# Patient Record
Sex: Female | Born: 2000 | Race: White | Hispanic: No | Marital: Single | Smoking: Current every day smoker
Health system: Southern US, Community
[De-identification: ages and names within clinical notes are randomized; demographics above are authoritative.]

## PROBLEM LIST (undated history)

## (undated) DIAGNOSIS — M51369 Other intervertebral disc degeneration, lumbar region without mention of lumbar back pain or lower extremity pain: Secondary | ICD-10-CM

## (undated) DIAGNOSIS — Q279 Congenital malformation of peripheral vascular system, unspecified: Secondary | ICD-10-CM

## (undated) DIAGNOSIS — M5136 Other intervertebral disc degeneration, lumbar region: Secondary | ICD-10-CM

## (undated) DIAGNOSIS — N926 Irregular menstruation, unspecified: Secondary | ICD-10-CM

## (undated) DIAGNOSIS — Z87442 Personal history of urinary calculi: Secondary | ICD-10-CM

## (undated) DIAGNOSIS — M419 Scoliosis, unspecified: Secondary | ICD-10-CM

## (undated) DIAGNOSIS — I82409 Acute embolism and thrombosis of unspecified deep veins of unspecified lower extremity: Secondary | ICD-10-CM

## (undated) DIAGNOSIS — M5126 Other intervertebral disc displacement, lumbar region: Secondary | ICD-10-CM

## (undated) DIAGNOSIS — D65 Disseminated intravascular coagulation [defibrination syndrome]: Secondary | ICD-10-CM

## (undated) DIAGNOSIS — N3289 Other specified disorders of bladder: Secondary | ICD-10-CM

## (undated) HISTORY — DX: Scoliosis, unspecified: M41.9

## (undated) HISTORY — PX: UPPER ENDOSCOPY W/ SCLEROTHERAPY: SHX2606

## (undated) HISTORY — DX: Personal history of urinary calculi: Z87.442

## (undated) HISTORY — DX: Irregular menstruation, unspecified: N92.6

## (undated) HISTORY — DX: Congenital malformation of peripheral vascular system, unspecified: Q27.9

## (undated) HISTORY — DX: Other specified disorders of bladder: N32.89

---
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
## 2018-02-07 ENCOUNTER — Encounter: Payer: Self-pay | Admitting: Family Medicine

## 2018-02-07 ENCOUNTER — Ambulatory Visit (INDEPENDENT_AMBULATORY_CARE_PROVIDER_SITE_OTHER): Payer: No Typology Code available for payment source | Admitting: Family Medicine

## 2018-02-07 VITALS — BP 100/80 | Temp 99.9°F | Ht 63.0 in | Wt 207.0 lb

## 2018-02-07 DIAGNOSIS — R300 Dysuria: Secondary | ICD-10-CM

## 2018-02-07 DIAGNOSIS — R509 Fever, unspecified: Secondary | ICD-10-CM | POA: Diagnosis not present

## 2018-02-07 DIAGNOSIS — J029 Acute pharyngitis, unspecified: Secondary | ICD-10-CM

## 2018-02-07 DIAGNOSIS — J020 Streptococcal pharyngitis: Secondary | ICD-10-CM

## 2018-02-07 LAB — POCT RAPID STREP A (OFFICE): Rapid Strep A Screen: POSITIVE — AB

## 2018-02-07 MED ORDER — AMOXICILLIN 500 MG PO CAPS: 500.0000 mg | ORAL_CAPSULE | Freq: Three times a day (TID) | ORAL | 0 refills | Status: DC

## 2018-02-07 NOTE — Progress Notes (Signed)
   Subjective:    Patient ID: Joanna Higgins, female    DOB: 2001/02/19, 17 y.o.   MRN: [id number]  HPI  Patient is here today with complaints of a fever,headache, States she is having body aches all over.She is states she is urinating blood for two days now.She is having some burning while urinating.She also states while in the shower yesterday and today she tastes blood in her mouth. She has been taking tylenol which has not helped. She has vomited once yesterday. She has not appetite.She states she can not urinate for us at this time. A cup and wipe were given to the pt.  Review of Systems  Constitutional: Positive for fatigue and fever. Negative for activity change.  HENT: Negative for congestion, ear pain and rhinorrhea.   Eyes: Negative for discharge.  Respiratory: Negative for cough, shortness of breath and wheezing.   Cardiovascular: Negative for chest pain.  Gastrointestinal: Positive for abdominal pain and nausea. Negative for diarrhea.  Genitourinary: Positive for dysuria and frequency.       Objective:   Physical Exam  Constitutional: She appears well-developed and well-nourished. No distress.  HENT:  Head: Normocephalic and atraumatic.  Eyes: Right eye exhibits no discharge. Left eye exhibits no discharge.  Neck: No tracheal deviation present.  Cardiovascular: Normal rate, regular rhythm and normal heart sounds.  No murmur heard. Pulmonary/Chest: Effort normal and breath sounds normal. No respiratory distress. She has no wheezes. She has no rales.  Musculoskeletal: She exhibits no edema.  Lymphadenopathy:    She has no cervical adenopathy.  Neurological: She is alert. She exhibits normal muscle tone.  Skin: Skin is warm and dry. No erythema.  Psychiatric: Her behavior is normal.  Vitals reviewed.         Assessment & Plan:  Strep throat Positive strep test Antibiotics prescribed Warning signs and what to watch for discussed  Send us a urine somewhere in  the next few days patient unable to give one today she complained of slight amount of blood in her urine this needs to be looked into further  The mother was also spoken with regarding her symptoms patient given work note for the next 3 days

## 2018-02-09 ENCOUNTER — Telehealth (INDEPENDENT_AMBULATORY_CARE_PROVIDER_SITE_OTHER): Payer: No Typology Code available for payment source | Admitting: *Deleted

## 2018-02-09 DIAGNOSIS — R319 Hematuria, unspecified: Secondary | ICD-10-CM | POA: Diagnosis not present

## 2018-02-09 LAB — POCT URINALYSIS DIPSTICK
Blood, UA: 10
PH UA: 6 (ref 5.0–8.0)
Spec Grav, UA: 1.02 (ref 1.010–1.025)
Urobilinogen, UA: 4 E.U./dL — AB

## 2018-02-09 NOTE — Telephone Encounter (Signed)
Urine sent for culture per dr Lorin Picketscott

## 2018-02-09 NOTE — Telephone Encounter (Signed)
Pt dropped off urine today. Seen 2 days ago and not able to give urine sample at that time.  Results for orders placed or performed in visit on 02/09/18  POCT urinalysis dipstick  Result Value Ref Range   Color, UA     Clarity, UA     Glucose, UA     Bilirubin, UA +    Ketones, UA     Spec Grav, UA 1.020 1.010 - 1.025   Blood, UA 10    pH, UA 6.0 5.0 - 8.0   Protein, UA     Urobilinogen, UA 4.0 (A) 0.2 or 1.0 E.U./dL   Nitrite, UA     Leukocytes, UA  Negative   Appearance     Odor

## 2018-02-13 ENCOUNTER — Other Ambulatory Visit: Payer: Self-pay | Admitting: *Deleted

## 2018-02-13 LAB — URINE CULTURE

## 2018-02-13 LAB — SPECIMEN STATUS REPORT

## 2018-02-13 MED ORDER — CEFDINIR 300 MG PO CAPS: 300.0000 mg | ORAL_CAPSULE | Freq: Two times a day (BID) | ORAL | 0 refills | Status: DC

## 2018-04-13 ENCOUNTER — Telehealth: Payer: Self-pay | Admitting: *Deleted

## 2018-04-13 NOTE — Telephone Encounter (Signed)
Spoke with pt. Pt states she is having stomach pain and has blood in urine. Pt wonders if IUD has moved. I advised she needs to be seen somewhere. I advised Urgent Care where they could check her urine and get med if needed. [facility] JSY

## 2018-04-19 ENCOUNTER — Ambulatory Visit: Payer: No Typology Code available for payment source | Admitting: Advanced Practice Midwife

## 2018-04-27 ENCOUNTER — Encounter: Payer: Self-pay | Admitting: Advanced Practice Midwife

## 2018-04-27 ENCOUNTER — Ambulatory Visit (INDEPENDENT_AMBULATORY_CARE_PROVIDER_SITE_OTHER): Payer: Medicaid Other | Admitting: Advanced Practice Midwife

## 2018-04-27 VITALS — BP 100/60 | HR 98 | Ht 63.0 in | Wt 199.0 lb

## 2018-04-27 DIAGNOSIS — Z309 Encounter for contraceptive management, unspecified: Secondary | ICD-10-CM

## 2018-04-27 DIAGNOSIS — Z01419 Encounter for gynecological examination (general) (routine) without abnormal findings: Secondary | ICD-10-CM

## 2018-04-27 MED ORDER — NORETHINDRONE 0.35 MG PO TABS: 1.0000 | ORAL_TABLET | Freq: Every day | ORAL | 11 refills | Status: DC

## 2018-04-27 NOTE — Progress Notes (Signed)
Joanna Higgins 17 y.o.  Vitals:   04/27/18 1523  BP: 100/60  Pulse: 98     Filed Weights   04/27/18 1523  Weight: 199 lb (90.3 kg)    Past Medical History: Past Medical History:  Diagnosis Date  . Bladder instability   . Irregular menstrual bleeding 02/10/2016  . Scoliosis   . Vascular malformation     Past Surgical History: Past Surgical History:  Procedure Laterality Date  . UPPER ENDOSCOPY W/ SCLEROTHERAPY      Family History: Family History  Problem Relation Age of Onset  . Arthritis Mother   . Arthritis Father   . Arthritis Maternal Grandmother   . Diabetes Maternal Grandfather   . Arthritis Maternal Grandfather   . Arthritis Paternal Grandfather     Social History: Social History   Tobacco Use  . Smoking status: Current Some Day Smoker    Types: E-cigarettes  . Smokeless tobacco: Never Used  Substance Use Topics  . Alcohol use: No    Alcohol/week: 0.0 oz  . Drug use: No    Allergies:  Allergies  Allergen Reactions  . 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 Hydrocodone Nausea And Vomiting      Current Outpatient Medications:  .  cefdinir (OMNICEF) 300 MG capsule, Take 1 capsule (300 mg total) by mouth 2 (two) times daily. (Patient not taking: Reported on 04/27/2018), Disp: 14 capsule, Rfl: 0  History of Present Illness: here for FP physical. Got Mirena 2/17. watns it out d/t cramps Can't take estrogen. Wants pills  May want to get pregnant later this year.  Says she can remember to take pill the same time every day   Review of Systems   Patient denies any headaches, blurred vision, shortness of breath, chest pain, problems with bowel movements, urination, or intercourse.   Physical Exam: General:  Well developed, well nourished, no acute distress Skin:  Warm and dry Neck:  Midline trachea, normal thyroid Lungs; Clear to auscultation bilaterally Breast:  No dominant palpable  mass, retraction, or nipple discharge Cardiovascular: Regular rate and rhythm Abdomen:  Soft, non tender, no hepatosplenomegaly Pelvic:  External genitalia is normal in appearance.  The vagina is normal in appearance.  The cervix is nulliparous.IUD strings visible Bedside US shows propter placement if IUD  Uterus is felt to be normal size, shape, stand contour.  No adnexal masses or tenderness noted. Exam limited by habitus Extremities:  No swelling or varicosities noted Psych:  No mood changes.     Impression: Normal GYN exam      Plan: start Micronor  Condom demonstration given. Remove IUD in 3 weeks

## 2018-05-18 ENCOUNTER — Encounter: Payer: Self-pay | Admitting: Advanced Practice Midwife

## 2018-05-18 ENCOUNTER — Ambulatory Visit (INDEPENDENT_AMBULATORY_CARE_PROVIDER_SITE_OTHER): Payer: Medicaid Other | Admitting: Advanced Practice Midwife

## 2018-05-18 VITALS — BP 110/80 | HR 94 | Ht 63.0 in | Wt 197.0 lb

## 2018-05-18 DIAGNOSIS — Z30432 Encounter for removal of intrauterine contraceptive device: Secondary | ICD-10-CM | POA: Diagnosis not present

## 2018-05-18 NOTE — Progress Notes (Signed)
  Joanna Higgins 17 y.o.  Vitals:   05/18/18 1050  BP: 110/80  Pulse: 94   Past Medical History:  Diagnosis Date  . Bladder instability   . Irregular menstrual bleeding 02/10/2016  . Scoliosis   . Vascular malformation    Past Surgical History:  Procedure Laterality Date  . UPPER ENDOSCOPY W/ SCLEROTHERAPY     family history includes Arthritis in her father, maternal grandfather, maternal grandmother, mother, and paternal grandfather; Diabetes in her maternal grandfather.  Current Outpatient Medications:  .  norethindrone (ORTHO MICRONOR) 0.35 MG tablet, Take 1 tablet (0.35 mg total) by mouth daily., Disp: 1 Package, Rfl: 11 .  cefdinir (OMNICEF) 300 MG capsule, Take 1 capsule (300 mg total) by mouth 2 (two) times daily. (Patient not taking: Reported on 04/27/2018), Disp: 14 capsule, Rfl: 0    Here for IUD removal.  She had the Mirena IUD placed in 2017 and would like it removed due to cramps. Started on POPs last month, remebers to take them.  A graves speculum was placed, and the strings were visible.  They were grasped with a curved Tresa Endo and the IUD easily removed.  Pt given IUD removal f/u instructions.

## 2018-06-07 ENCOUNTER — Ambulatory Visit: Payer: Medicaid Other | Admitting: Family Medicine

## 2018-06-22 ENCOUNTER — Encounter (HOSPITAL_COMMUNITY): Payer: Self-pay

## 2018-06-22 ENCOUNTER — Emergency Department (HOSPITAL_COMMUNITY): Payer: Self-pay

## 2018-06-22 ENCOUNTER — Ambulatory Visit (HOSPITAL_COMMUNITY)
Admission: RE | Admit: 2018-06-22 | Discharge: 2018-06-22 | Disposition: A | Discharge status: Home / Self Care | Payer: Self-pay | Source: Ambulatory Visit | Attending: Emergency Medicine | Admitting: Emergency Medicine

## 2018-06-22 ENCOUNTER — Emergency Department (HOSPITAL_COMMUNITY)
Admission: EM | Admit: 2018-06-22 | Discharge: 2018-06-22 | Disposition: A | Discharge status: Home / Self Care | Payer: Self-pay | Attending: Emergency Medicine | Admitting: Emergency Medicine

## 2018-06-22 DIAGNOSIS — Z86718 Personal history of other venous thrombosis and embolism: Secondary | ICD-10-CM | POA: Insufficient documentation

## 2018-06-22 DIAGNOSIS — R000 Tachycardia, unspecified: Secondary | ICD-10-CM | POA: Insufficient documentation

## 2018-06-22 DIAGNOSIS — M79604 Pain in right leg: Secondary | ICD-10-CM

## 2018-06-22 DIAGNOSIS — Z79899 Other long term (current) drug therapy: Secondary | ICD-10-CM | POA: Insufficient documentation

## 2018-06-22 DIAGNOSIS — F17290 Nicotine dependence, other tobacco product, uncomplicated: Secondary | ICD-10-CM | POA: Insufficient documentation

## 2018-06-22 LAB — CBC WITH DIFFERENTIAL/PLATELET
Basophils Absolute: 0 10*3/uL (ref 0.0–0.1)
Basophils Relative: 0 %
Eosinophils Absolute: 0.5 10*3/uL (ref 0.0–0.7)
Eosinophils Relative: 4 %
HEMATOCRIT: 42.7 % (ref 36.0–46.0)
Hemoglobin: 13.7 g/dL (ref 12.0–15.0)
LYMPHS PCT: 31 %
Lymphs Abs: 4 10*3/uL (ref 0.7–4.0)
MCH: 27.3 pg (ref 26.0–34.0)
MCHC: 32.1 g/dL (ref 30.0–36.0)
MCV: 85.1 fL (ref 78.0–100.0)
MONOS PCT: 9 %
Monocytes Absolute: 1.1 10*3/uL — ABNORMAL HIGH (ref 0.1–1.0)
NEUTROS ABS: 7.4 10*3/uL (ref 1.7–7.7)
NEUTROS PCT: 56 %
PLATELETS: 282 10*3/uL (ref 150–400)
RBC: 5.02 MIL/uL (ref 3.87–5.11)
RDW: 14.1 % (ref 11.5–15.5)
WBC: 13 10*3/uL — ABNORMAL HIGH (ref 4.0–10.5)

## 2018-06-22 LAB — BASIC METABOLIC PANEL
Anion gap: 9 (ref 5–15)
BUN: 16 mg/dL (ref 6–20)
CO2: 25 mmol/L (ref 22–32)
Calcium: 9.3 mg/dL (ref 8.9–10.3)
Chloride: 107 mmol/L (ref 98–111)
Creatinine, Ser: 0.73 mg/dL (ref 0.44–1.00)
GFR calc Af Amer: >60 mL/min (ref >60)
GLUCOSE: 129 mg/dL — AB (ref 70–99)
POTASSIUM: 3.8 mmol/L (ref 3.5–5.1)
Sodium: 141 mmol/L (ref 135–145)

## 2018-06-22 LAB — I-STAT BETA HCG BLOOD, ED (MC, WL, AP ONLY)

## 2018-06-22 MED ORDER — METHOCARBAMOL 500 MG PO TABS: 500.0000 mg | ORAL_TABLET | Freq: Three times a day (TID) | ORAL | 0 refills | Status: DC | PRN

## 2018-06-22 MED ORDER — ENOXAPARIN SODIUM 100 MG/ML ~~LOC~~ SOLN
90.0000 mg | Freq: Once | SUBCUTANEOUS | Status: AC
  Administered 2018-06-22: 90 mg via SUBCUTANEOUS
  Filled 2018-06-22: qty 1

## 2018-06-22 MED ORDER — SODIUM CHLORIDE 0.9 % IV BOLUS
1000.0000 mL | Freq: Once | INTRAVENOUS | Status: AC
Start: 2018-06-22 — End: 2018-06-22
  Administered 2018-06-22: 1000 mL via INTRAVENOUS

## 2018-06-22 MED ORDER — IOPAMIDOL (ISOVUE-370) INJECTION 76%
100.0000 mL | Freq: Once | INTRAVENOUS | Status: AC | PRN
  Administered 2018-06-22: 100 mL via INTRAVENOUS

## 2018-06-22 MED ORDER — METHYLPREDNISOLONE 4 MG PO TBPK: ORAL_TABLET | ORAL | 0 refills | Status: DC

## 2018-06-22 MED ORDER — KETOROLAC TROMETHAMINE 30 MG/ML IJ SOLN
30.0000 mg | Freq: Once | INTRAMUSCULAR | Status: AC
  Administered 2018-06-22: 30 mg via INTRAVENOUS
  Filled 2018-06-22: qty 1

## 2018-06-22 NOTE — ED Notes (Signed)
Patient transported to CT 

## 2018-06-22 NOTE — ED Triage Notes (Signed)
Pt reports pain in her right leg from the ankle to the hip x 4 days, states she was diagnosed with blood clot in upper leg when seen at [facility] last month, states has been on lovenox but has run out a week ago.

## 2018-06-22 NOTE — ED Provider Notes (Signed)
[facility] EMERGENCY DEPARTMENT Provider Note   CSN: [id number] Arrival date & time: 06/22/18  0219     History   Chief Complaint Chief Complaint  Patient presents with  . Leg Pain    hx of dvt    HPI Joanna Higgins is a 17 y.o. female.  Patient is a very difficult historian.  She is here with right leg pain from her hip all the way down to her toes which is been ongoing for the past 1 month.  She denies any falls or trauma.  States she was seen at [facility] about 2 weeks ago and told she had a DVT in her right leg and was given 12 days worth of Lovenox which she finished 2 days ago.  She is unclear what the clot extent was and she is unclear what the plan was after these 12 days of Lovenox.  She is never had a blood clot before.  She states that she came in tonight because the pain is worsening and radiates from her buttock all the way down her leg.  There is no numbness or tingling.  There is no bowel or bladder incontinence.  There is no fever or vomiting.  There is no history of cancer or IV drug abuse.  Patient did have a MRI of her lumbar spine in October that showed multilevel disc bulging.  No previous back surgery.  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  She was told to stop her birth control because of the blood clot.  The history is provided by the patient.  Leg Pain      Past Medical History:  Diagnosis Date  . Bladder instability   . Irregular menstrual bleeding 02/10/2016  . Scoliosis   . Vascular malformation     Patient Active Problem List   Diagnosis Date Noted  . Encounter for IUD insertion 02/18/2016  . Irregular menstrual bleeding 02/10/2016  . Learning disability 08/22/2015  . ADD (attention deficit disorder) 08/22/2015  . 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 DUB (dysfunctional  uterine bleeding) 12/03/2013  . Anemia 12/03/2013  . Scoliosis 04/28/2013  . Adiposity 10/19/2012  . 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 UPPER ENDOSCOPY W/ SCLEROTHERAPY       OB History    Gravida  0   Para  0   Term  0   Preterm  0   AB  0   Living  0     SAB  0   TAB  0   Ectopic  0   Multiple  0   Live Births               Home Medications    Prior to Admission medications   Medication Sig Start Date End Date Taking? Authorizing Provider  cefdinir (OMNICEF) 300 MG capsule Take 1 capsule (300 mg total) by mouth 2 (two) times daily. Patient not taking: Reported on 04/27/2018 02/13/18   Babs Sciara, MD  norethindrone (ORTHO MICRONOR) 0.35 MG tablet Take 1 tablet (0.35 mg total) by mouth daily. 04/27/18   Jacklyn Shell, CNM    Family History Family History  Problem Relation Age of Onset  . Arthritis Mother   . Arthritis Father   . Arthritis Maternal Grandmother   . Diabetes Maternal  Grandfather   . Arthritis Maternal Grandfather   . Arthritis Paternal Grandfather     Social History Social History   Tobacco Use  . Smoking status: Current Some Day Smoker    Types: E-cigarettes  . Smokeless tobacco: Never Used  Substance Use Topics  . Alcohol use: No    Alcohol/week: 0.0 oz  . Drug use: No     Allergies   Estrogens and Hydrocodone   Review of Systems Review of Systems  Constitutional: Negative for activity change, appetite change and fever.  HENT: Negative for congestion and sinus pain.   Eyes: Negative for visual disturbance.  Respiratory: Negative for cough, chest tightness and shortness of breath.   Cardiovascular: Positive for leg swelling. Negative for chest pain.  Gastrointestinal: Negative for abdominal pain, nausea and vomiting.  Genitourinary: Negative for dysuria, hematuria, vaginal bleeding and vaginal discharge.  Musculoskeletal: Positive for  arthralgias and myalgias. Negative for back pain.  Neurological: Negative for dizziness, weakness and headaches.   all other systems are negative except as noted in the HPI and PMH.     Physical Exam Updated Vital Signs BP (!) 128/99 (BP Location: Right Arm)   Pulse (!) 124   Temp 98.4 F (36.9 C) (Oral)   Resp 18   Ht 5\' 3"  (1.6 m)   Wt 89.4 kg (197 lb)   LMP 06/14/2018   SpO2 99%   BMI 34.90 kg/m   Physical Exam  Constitutional: She is oriented to person, place, and time. She appears well-developed and well-nourished. No distress.  HENT:  Head: Normocephalic and atraumatic.  Mouth/Throat: Oropharynx is clear and moist. No oropharyngeal exudate.  Eyes: Pupils are equal, round, and reactive to light. Conjunctivae and EOM are normal.  Neck: Normal range of motion. Neck supple.  No meningismus.  Cardiovascular: Normal rate, normal heart sounds and intact distal pulses.  No murmur heard. Tachycardic 120s  Pulmonary/Chest: Effort normal and breath sounds normal. No respiratory distress.  Abdominal: Soft. There is no tenderness. There is no rebound and no guarding.  Musculoskeletal: Normal range of motion. She exhibits tenderness. She exhibits no edema.  Multiple vascular malformations her right flank and abdomen.  There is no midline tenderness  5/5 strength in bilateral lower extremities. Ankle plantar and dorsiflexion intact. Great toe extension intact bilaterally. +2 DP and PT pulses. +2 patellar reflexes bilaterally. Normal gait.  There is no calf asymmetry or palpable cords.  There is no asymmetric swelling.  Distal pulses are intact  Neurological: She is alert and oriented to person, place, and time. No cranial nerve deficit. She exhibits normal muscle tone. Coordination normal.   5/5 strength throughout. CN 2-12 intact.Equal grip strength.   Skin: Skin is warm.  Psychiatric: She has a normal mood and affect. Her behavior is normal.  Nursing note and vitals  reviewed.    ED Treatments / Results  Labs (all labs ordered are listed, but only abnormal results are displayed) Labs Reviewed  CBC WITH DIFFERENTIAL/PLATELET - Abnormal; Notable for the following components:      Result Value   WBC 13.0 (*)    Monocytes Absolute 1.1 (*)    All other components within normal limits  BASIC METABOLIC PANEL - Abnormal; Notable for the following components:   Glucose, Bld 129 (*)    All other components within normal limits  URINALYSIS, ROUTINE W REFLEX MICROSCOPIC  RAPID URINE DRUG SCREEN, HOSP PERFORMED  I-STAT BETA HCG BLOOD, ED ([facility], [facility], [facility] ONLY)    EKG None  Radiology Ct Angio Chest Pe W And/or Wo Contrast  Result Date: 06/22/2018 CLINICAL DATA:  17 year old female with right leg pain and recent history of DVT. Concern for pulmonary embolism. EXAM: CT ANGIOGRAPHY CHEST WITH CONTRAST TECHNIQUE: Multidetector CT imaging of the chest was performed using the standard protocol during bolus administration of intravenous contrast. Multiplanar CT image reconstructions and MIPs were obtained to evaluate the vascular anatomy. CONTRAST:  ISOVUE-370 IOPAMIDOL (ISOVUE-370) INJECTION 76% COMPARISON:  Chest radiograph dated 11/23/2017 FINDINGS: Cardiovascular: There is no cardiomegaly or pericardial effusion. The thoracic aorta is unremarkable. The origins of the great vessels of the aortic arch are patent. There is no CT evidence of pulmonary embolism. Mediastinum/Nodes: No hilar or mediastinal adenopathy. Esophagus and the thyroid gland are grossly unremarkable. Residual thymic tissue noted in the anterior mediastinum. Lungs/Pleura: The lungs are clear. There is no pleural effusion or pneumothorax. The central airways are patent. Upper Abdomen: No acute abnormality. Musculoskeletal: Thoracic dextroscoliosis. No acute osseous pathology. Review of the MIP images confirms the above findings. IMPRESSION: No acute intrathoracic pathology. No CT evidence of pulmonary  embolism. Electronically Signed   By: Elgie Collard M.D.   On: 06/22/2018 04:25    Procedures Procedures (including critical care time)  Medications Ordered in ED Medications  iopamidol (ISOVUE-370) 76 % injection 100 mL (100 mLs Intravenous Contrast Given 06/22/18 0407)     Initial Impression / Assessment and Plan / ED Course  I have reviewed the triage vital signs and the nursing notes.  Pertinent labs & imaging results that were available during my care of the patient were reviewed by me and considered in my medical decision making (see chart for details).    Patient with 1 month of right leg pain, recently diagnosed with DVT at outside hospital and completed 12 days of Lovenox.  No documentation available in care everywhere.  Patient did have Doppler study in 2017 which is negative. we will attempt to obtain records.  Her presentation today seems more consistent with sciatica.  There is no evidence of cauda equina or cord compression.  Patient did have MRI in 2018 that showed disc bulging at L5-S1.  Given patient's tachycardia and complaint of shortness of breath and history of DVT, CT scan obtained to evaluate for pulmonary embolism.  This is negative.  Record for DVT study was requested from [facility] but they do not have any records available.  Patient's mother has arrived.  She states that patient is confused over her history.  Patient is prescribed Lovenox for her vascular malformation of her flank which she uses from time to time.  She is never been diagnosed with a DVT in her leg.  Mother states she has not been to [facility] since 2016.  Patient's presentation today seems consistent with sciatica.  There are no neurological red flags.  Will give dose of Lovenox and obtain DVT study in the morning.  Treat for sciatica.  No evidence of cord compression or cauda equina.  Patient to return later today for DVT study of right leg.  Return precautions discussed.  Heart rate has improved to  the 90s.  Final Clinical Impressions(s) / ED Diagnoses   Final diagnoses:  Right leg pain    ED Discharge Orders    None       Zohar Maroney, Jeannett Senior, MD 06/22/18 [phone]

## 2018-06-22 NOTE — Discharge Instructions (Addendum)
It appears your pain is likely due to sciatica or pinched nerve in your back.  Take the steroids and anti-inflammatories as prescribed.  You should follow-up later today to have an ultrasound of your leg to rule out blood clot.  Though it does not seem that she had a blood clot in this leg in the past.  Follow-up with your doctor.  Return to the ED if you develop new or worsening symptoms.

## 2018-06-22 NOTE — ED Provider Notes (Signed)
Blood pressure 103/60, pulse 97, temperature 98.4 F (36.9 C), temperature source Oral, resp. rate 18, height 5\' 3"  (1.6 m), weight 89.4 kg (197 lb), last menstrual period 06/14/2018, SpO2 100 %.   In short, Joanna Higgins is a 17 y.o. female with a chief complaint of Leg Pain (hx of dvt) .  Refer to the original H&P for additional details.  09:31 AM Patient returns to the emergency department this morning for right lower extremity ultrasound to rule out DVT.  Clinical suspicion upon her initial exam was elevated for sciatica but patient had initially reported recent history of DVT.  This was contradicted by her mother who arrived later.  Ultrasound shows no evidence of acute right lower extremity DVT.   Alona BeneJoshua Estefana Taylor, MD     Maia PlanLong, Eurika Sandy G, MD 06/22/18 [phone]

## 2018-06-25 ENCOUNTER — Encounter (HOSPITAL_COMMUNITY): Payer: Self-pay | Admitting: Emergency Medicine

## 2018-06-25 ENCOUNTER — Emergency Department (HOSPITAL_COMMUNITY)
Admission: EM | Admit: 2018-06-25 | Discharge: 2018-06-26 | Disposition: A | Discharge status: Home / Self Care | Payer: Medicaid Other | Attending: Emergency Medicine | Admitting: Emergency Medicine

## 2018-06-25 DIAGNOSIS — Z79899 Other long term (current) drug therapy: Secondary | ICD-10-CM | POA: Insufficient documentation

## 2018-06-25 DIAGNOSIS — F17210 Nicotine dependence, cigarettes, uncomplicated: Secondary | ICD-10-CM | POA: Insufficient documentation

## 2018-06-25 DIAGNOSIS — M5416 Radiculopathy, lumbar region: Secondary | ICD-10-CM | POA: Insufficient documentation

## 2018-06-25 DIAGNOSIS — M5410 Radiculopathy, site unspecified: Secondary | ICD-10-CM

## 2018-06-25 MED ORDER — KETOROLAC TROMETHAMINE 60 MG/2ML IM SOLN
30.0000 mg | Freq: Once | INTRAMUSCULAR | Status: AC
  Administered 2018-06-26: 30 mg via INTRAMUSCULAR
  Filled 2018-06-25: qty 2

## 2018-06-25 MED ORDER — CYCLOBENZAPRINE HCL 10 MG PO TABS: 10.0000 mg | ORAL_TABLET | Freq: Every day | ORAL | 0 refills | Status: AC

## 2018-06-25 MED ORDER — CYCLOBENZAPRINE HCL 10 MG PO TABS
5.0000 mg | ORAL_TABLET | Freq: Once | ORAL | Status: AC
  Administered 2018-06-26: 5 mg via ORAL
  Filled 2018-06-25: qty 1

## 2018-06-25 NOTE — Discharge Instructions (Addendum)
You may use over-the-counter Motrin (Ibuprofen), Acetaminophen (Tylenol), topical muscle creams such as SalonPas, Federal-Mogulcy Hot, Bengay, etc. Please stretch, apply heat or cold, and have massage therapy for additional assistance.

## 2018-06-25 NOTE — ED Provider Notes (Addendum)
[facility] EMERGENCY DEPARTMENT Provider Note  CSN: [id number] Arrival date & time: 06/25/18 2007  Chief Complaint(s) Leg Pain  HPI Joanna Higgins is a 17 y.o. female with a history of scoliosis and AVMs with flank varicosities requiring intermittent Lovenox treatments, noted to have bulging disc in L5/S1 noted on MRI obtain October 2018,  who presents to the emergency department with right lower back pain radiating to the right lower extremity.  There is been ongoing for several weeks.  Pain is shooting and throbbing in nature.  Exacerbated with movement and palpation of the lower back and right lower extremity.  No associated swelling.  Alleviated only with immobility.  Patient was seen 3 days ago at any pain and ruled out for DVT.  She was prescribed muscle relaxers but has not noted any improvement.  She denies any recent trauma or injuries.  No bladder/bowel incontinence.  No lower extremity weakness or loss of sensation.  HPI  Past Medical History Past Medical History:  Diagnosis Date  . Bladder instability   . Irregular menstrual bleeding 02/10/2016  . Scoliosis   . Vascular malformation    Patient Active Problem List   Diagnosis Date Noted  . Encounter for IUD insertion 02/18/2016  . Irregular menstrual bleeding 02/10/2016  . Learning disability 08/22/2015  . ADD (attention deficit disorder) 08/22/2015  . 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 DUB (dysfunctional uterine bleeding) 12/03/2013  . Anemia 12/03/2013  . Scoliosis 04/28/2013  . Adiposity 10/19/2012  . 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 Authorizing Provider  cefdinir (OMNICEF) 300 MG capsule Take 1 capsule (300 mg total) by mouth 2 (two) times daily. Patient not taking: Reported on 04/27/2018 02/13/18   Babs Sciara, MD   cyclobenzaprine (FLEXERIL) 10 MG tablet Take 1 tablet (10 mg total) by mouth at bedtime for 10 days. 06/25/18 07/05/18  Nira Conn, MD  methocarbamol (ROBAXIN) 500 MG tablet Take 1 tablet (500 mg total) by mouth every 8 (eight) hours as needed for muscle spasms. 06/22/18   Rancour, Jeannett Senior, MD  methylPREDNISolone (MEDROL DOSEPAK) 4 MG TBPK tablet As directed 06/22/18   Rancour, Jeannett Senior, MD  norethindrone (ORTHO MICRONOR) 0.35 MG tablet Take 1 tablet (0.35 mg total) by mouth daily. 04/27/18   Jacklyn Shell, CNM                                                                                                                                    Past Surgical History Past Surgical History:  Procedure Laterality Date  . UPPER ENDOSCOPY W/ SCLEROTHERAPY     Family History Family History  Problem Relation Age of Onset  . Arthritis Mother   . Arthritis Father   . Arthritis Maternal Grandmother   . Diabetes Maternal Grandfather   .  Arthritis Maternal Grandfather   . Arthritis Paternal Grandfather     Social History Social History   Tobacco Use  . Smoking status: Current Some Day Smoker    Types: E-cigarettes  . Smokeless tobacco: Never Used  Substance Use Topics  . Alcohol use: No    Alcohol/week: 0.0 oz  . Drug use: No   Allergies Estrogens and Hydrocodone  Review of Systems Review of Systems All other systems are reviewed and are negative for acute change except as noted in the HPI  Physical Exam Vital Signs  I have reviewed the triage vital signs BP 106/72 (BP Location: Right Arm)   Pulse (!) 120   Temp 98.4 F (36.9 C) (Oral)   Resp 16   Ht 5\' 3"  (1.6 m)   Wt 89.4 kg (197 lb)   LMP 06/14/2018   SpO2 99%   BMI 34.90 kg/m   Physical Exam  Constitutional: She is oriented to person, place, and time. She appears well-developed and well-nourished. No distress.  HENT:  Head: Normocephalic and atraumatic.  Right Ear: External ear normal.  Left Ear:  External ear normal.  Nose: Nose normal.  Eyes: Conjunctivae and EOM are normal. No scleral icterus.  Neck: Normal range of motion and phonation normal.  Cardiovascular: Normal rate and regular rhythm.  Pulmonary/Chest: Effort normal. No stridor. No respiratory distress.  Abdominal: She exhibits no distension.  Musculoskeletal: Normal range of motion. She exhibits no edema.       Lumbar back: She exhibits tenderness and pain.       Back:       Right upper leg: She exhibits tenderness. She exhibits no edema.  Neurological: She is alert and oriented to person, place, and time. She has normal strength. No sensory deficit.  Skin: She is not diaphoretic.  Psychiatric: She has a normal mood and affect. Her behavior is normal.  Vitals reviewed.   ED Results and Treatments Labs (all labs ordered are listed, but only abnormal results are displayed) Labs Reviewed - No data to display                                                                                                                       EKG  EKG Interpretation  Date/Time:    Ventricular Rate:    PR Interval:    QRS Duration:   QT Interval:    QTC Calculation:   R Axis:     Text Interpretation:        Radiology No results found. Pertinent labs & imaging results that were available during my care of the patient were reviewed by me and considered in my medical decision making (see chart for details).  Medications Ordered in ED Medications  ketorolac (TORADOL) injection 30 mg (has no administration in time range)  cyclobenzaprine (FLEXERIL) tablet 5 mg (has no administration in time range)  Procedures Procedures  (including critical care time)  Medical Decision Making / ED Course I have reviewed the nursing notes for this encounter and the patient's prior records (if available in EHR or on  provided paperwork).    Patient known to have herniated disks having radicular pain on the right side.  No evidence of cauda equina.  No need for repeat imaging.  Mother reports that patient already has a prescription for steroids but she has not filled it yet.   Treated with IM Toradol and oral Flexeril.  Will change prescription from Robaxin to Flexeril.  Patient already has an orthopedist and has a follow-up appointment within 2 weeks.  The patient appears reasonably screened and/or stabilized for discharge and I doubt any other medical condition or other [facility] requiring further screening, evaluation, or treatment in the ED at this time prior to discharge.  The patient is safe for discharge with strict return precautions.   Final Clinical Impression(s) / ED Diagnoses Final diagnoses:  Radicular pain    Disposition: Discharge  Condition: Good  I have discussed the results, Dx and Tx plan with the patient and motehrwho expressed understanding and agree(s) with the plan. Discharge instructions discussed at great length. The patient and mother were given strict return precautions who verbalized understanding of the instructions. No further questions at time of discharge.    ED Discharge Orders        Ordered    cyclobenzaprine (FLEXERIL) 10 MG tablet  Daily at bedtime     06/25/18 2337       Follow Up: Babs Sciara, MD [address] [place] [address] [phone]     Orthopedist   As scheduled     This chart was dictated using voice recognition software.  Despite best efforts to proofread,  errors can occur which can change the documentation meaning.     Nira Conn, MD 06/25/18 2337

## 2018-06-25 NOTE — ED Triage Notes (Addendum)
Pt c/o pain from R hip down R leg with numbness to R foot.  Pt is a [facility] patient for birth defect, vascular malformation in right lower back, pt has had several blood clots, no longer on Lovenox.  Pt was seen at [facility], had CT, dx with bulging disk. Per mother pt's pain is not controlled with medication .  Neg DVT study.  Per mother has not started prednisone and does have Ortho appointment on July 8th if insurance can be put back in place.

## 2018-06-27 ENCOUNTER — Ambulatory Visit (INDEPENDENT_AMBULATORY_CARE_PROVIDER_SITE_OTHER): Payer: Medicaid Other | Admitting: Advanced Practice Midwife

## 2018-06-27 ENCOUNTER — Other Ambulatory Visit: Payer: Self-pay

## 2018-06-27 ENCOUNTER — Encounter: Payer: Self-pay | Admitting: Advanced Practice Midwife

## 2018-06-27 VITALS — BP 109/73 | HR 98 | Ht 63.0 in | Wt 196.0 lb

## 2018-06-27 DIAGNOSIS — Z30017 Encounter for initial prescription of implantable subdermal contraceptive: Secondary | ICD-10-CM | POA: Diagnosis not present

## 2018-06-27 NOTE — Progress Notes (Signed)
[facility] Visit  Patient name: Joanna Higgins MRN [id number]  Date of birth: 2001/12/19  CC & HPI:  Joanna Higgins is a 17 y.o. Caucasian female presenting today for birth control options. Has been on micronoir for a few months, can't remember to take therm. Thought that she had a blood clot, but [place] neg.  Birth control options discussed: depo, Nexplanon, IUD (hormonal and nonhormonal).  Had IUD removed d/t cramping, so advised Nexplanon. Risks/benefits/side effects of each discussed.  Pt chooses Nexplanon.  Not having sex w/anyone now. LMP 6/19, not taking BC at all now. .    Pertinent History Reviewed:  Medical & Surgical Hx:   Past Medical History:  Diagnosis Date  . Bladder instability   . Irregular menstrual bleeding 02/10/2016  . Scoliosis   . Vascular malformation    Past Surgical History:  Procedure Laterality Date  . UPPER ENDOSCOPY W/ SCLEROTHERAPY     Family History  Problem Relation Age of Onset  . Arthritis Mother   . Arthritis Father   . Arthritis Maternal Grandmother   . Diabetes Maternal Grandfather   . Arthritis Maternal Grandfather   . Arthritis Paternal Grandfather     Current Outpatient Medications:  .  cyclobenzaprine (FLEXERIL) 10 MG tablet, Take 1 tablet (10 mg total) by mouth at bedtime for 10 days., Disp: 10 tablet, Rfl: 0 .  methocarbamol (ROBAXIN) 500 MG tablet, Take 1 tablet (500 mg total) by mouth every 8 (eight) hours as needed for muscle spasms., Disp: 20 tablet, Rfl: 0 .  methylPREDNISolone (MEDROL DOSEPAK) 4 MG TBPK tablet, As directed, Disp: 21 tablet, Rfl: 0 .  norethindrone (ORTHO MICRONOR) 0.35 MG tablet, Take 1 tablet (0.35 mg total) by mouth daily. (Patient not taking: Reported on 06/27/2018), Disp: 1 Package, Rfl: 11 Social History: Reviewed -  reports that she has been smoking e-cigarettes.  She has never used smokeless tobacco.  Review of Systems:   Constitutional: Negative for fever and chills Eyes: Negative for visual  disturbances Respiratory: Negative for shortness of breath, dyspnea Cardiovascular: Negative for chest pain or palpitations  Gastrointestinal: Negative for vomiting, diarrhea and constipation; no abdominal pain Genitourinary: Negative for dysuria and urgency, vaginal irritation or itching Musculoskeletal: Negative for back pain, joint pain, myalgias  Neurological: Negative for dizziness and headaches    Objective Findings:    Physical Examination: Vitals:   06/27/18 1008  BP: 109/73  Pulse: 98   General appearance - well appearing, and in no distress Mental status - alert, oriented to person, place, and time Chest:  Normal respiratory effort Heart - normal rate and regular rhythm Abdomen:  Soft, nontender Pelvic: deferred Musculoskeletal:  Normal range of motion without pain Extremities:  No edema    No results found for this or any previous visit (from the past 24 hour(s)).    Assessment & Plan:  A:   Contraception mgt P:     Return in about 3 weeks (around 07/18/2018) for order and schedule nexplanon.   [facility]  Yassine Brunsman Cresenzo-Dishmon CNM 06/27/2018 10:30 AM

## 2018-07-19 ENCOUNTER — Encounter: Payer: Medicaid Other | Admitting: Advanced Practice Midwife

## 2018-07-25 ENCOUNTER — Ambulatory Visit (INDEPENDENT_AMBULATORY_CARE_PROVIDER_SITE_OTHER): Payer: Medicaid Other | Admitting: Advanced Practice Midwife

## 2018-07-25 ENCOUNTER — Encounter: Payer: Self-pay | Admitting: Advanced Practice Midwife

## 2018-07-25 VITALS — BP 126/75 | HR 97 | Ht 63.0 in | Wt 198.0 lb

## 2018-07-25 DIAGNOSIS — Z3049 Encounter for surveillance of other contraceptives: Secondary | ICD-10-CM

## 2018-07-25 DIAGNOSIS — Z30017 Encounter for initial prescription of implantable subdermal contraceptive: Secondary | ICD-10-CM | POA: Insufficient documentation

## 2018-07-25 DIAGNOSIS — Z3202 Encounter for pregnancy test, result negative: Secondary | ICD-10-CM

## 2018-07-25 LAB — POCT URINE PREGNANCY: Preg Test, Ur: NEGATIVE

## 2018-07-25 MED ORDER — ETONOGESTREL 68 MG ~~LOC~~ IMPL
68.0000 mg | DRUG_IMPLANT | Freq: Once | SUBCUTANEOUS | Status: AC
  Administered 2018-07-25: 68 mg via SUBCUTANEOUS

## 2018-07-25 NOTE — Addendum Note (Signed)
Addended by: Federico FlakeNES, Shayan Bramhall A on: 07/25/2018 11:54 AM   Modules accepted: Orders

## 2018-07-25 NOTE — Progress Notes (Signed)
  HPI:  Joanna Higgins is a 17 y.o. year old Caucasian female here for Nexplanon insertion.  Her LMP was 7/15, last intercourse on 7/14 , and her pregnancy test today was negative.  Risks/benefits/side effects of Nexplanon have been discussed and her questions have been answered.  Specifically, a failure rate of 12/998 has been reported, with an increased failure rate if pt takes St. John's Wort and/or antiseizure medicaitons.  Joanna Higgins is aware of the common side effect of irregular bleeding, which the incidence of decreases over time.   Past Medical History: Past Medical History:  Diagnosis Date  . Bladder instability   . Irregular menstrual bleeding 02/10/2016  . Scoliosis   . Vascular malformation     Past Surgical History: Past Surgical History:  Procedure Laterality Date  . UPPER ENDOSCOPY W/ SCLEROTHERAPY      Family History: Family History  Problem Relation Age of Onset  . Arthritis Mother   . Arthritis Father   . Arthritis Maternal Grandmother   . Diabetes Maternal Grandfather   . Arthritis Maternal Grandfather   . Arthritis Paternal Grandfather     Social History: Social History   Tobacco Use  . Smoking status: Current Some Day Smoker    Types: E-cigarettes  . Smokeless tobacco: Never Used  Substance Use Topics  . Alcohol use: No    Alcohol/week: 0.0 oz  . Drug use: No    Allergies:  Allergies  Allergen Reactions  . 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 Hydrocodone Nausea And Vomiting      Her left arm, approximatly 4 inches proximal from the elbow, was cleansed with alcohol and anesthetized with 2cc of 2% Lidocaine.  The area was cleansed again and the Nexplanon was inserted without difficulty.  A pressure bandage was applied.  Pt was instructed to remove pressure bandage in a few hours, and keep insertion site covered with a bandaid for 3 days.  Back up contraception was  recommended for 2 weeks.  Follow-up scheduled PRN problems  Joanna Higgins 07/25/2018 11:15 AM

## 2018-09-14 ENCOUNTER — Telehealth: Payer: Self-pay | Admitting: Family Medicine

## 2018-09-14 ENCOUNTER — Emergency Department
Admission: EM | Admit: 2018-09-14 | Discharge: 2018-09-14 | Disposition: A | Discharge status: Home / Self Care | Payer: 59 | Attending: Emergency Medicine | Admitting: Emergency Medicine

## 2018-09-14 ENCOUNTER — Other Ambulatory Visit: Payer: Self-pay

## 2018-09-14 ENCOUNTER — Emergency Department: Payer: 59

## 2018-09-14 DIAGNOSIS — J189 Pneumonia, unspecified organism: Secondary | ICD-10-CM | POA: Diagnosis not present

## 2018-09-14 DIAGNOSIS — F17290 Nicotine dependence, other tobacco product, uncomplicated: Secondary | ICD-10-CM | POA: Insufficient documentation

## 2018-09-14 DIAGNOSIS — R112 Nausea with vomiting, unspecified: Secondary | ICD-10-CM | POA: Insufficient documentation

## 2018-09-14 DIAGNOSIS — R1031 Right lower quadrant pain: Secondary | ICD-10-CM | POA: Diagnosis present

## 2018-09-14 DIAGNOSIS — N3001 Acute cystitis with hematuria: Secondary | ICD-10-CM

## 2018-09-14 LAB — URINALYSIS, COMPLETE (UACMP) WITH MICROSCOPIC
BACTERIA UA: NONE SEEN
BILIRUBIN URINE: NEGATIVE
Glucose, UA: NEGATIVE mg/dL
KETONES UR: 5 mg/dL — AB
NITRITE: NEGATIVE
PROTEIN: 30 mg/dL — AB
SPECIFIC GRAVITY, URINE: 1.025 (ref 1.005–1.030)
pH: 5 (ref 5.0–8.0)

## 2018-09-14 LAB — CBC
HCT: 38.8 % (ref 35.0–47.0)
Hemoglobin: 13.2 g/dL (ref 12.0–16.0)
MCH: 29 pg (ref 26.0–34.0)
MCHC: 34 g/dL (ref 32.0–36.0)
MCV: 85.1 fL (ref 80.0–100.0)
Platelets: 239 10*3/uL (ref 150–440)
RBC: 4.56 MIL/uL (ref 3.80–5.20)
RDW: 15.6 % — AB (ref 11.5–14.5)
WBC: 9.2 10*3/uL (ref 3.6–11.0)

## 2018-09-14 LAB — POCT PREGNANCY, URINE: PREG TEST UR: NEGATIVE

## 2018-09-14 LAB — COMPREHENSIVE METABOLIC PANEL
ALK PHOS: 56 U/L (ref 38–126)
ALT: 15 U/L (ref 0–44)
AST: 15 U/L (ref 15–41)
Albumin: 4.1 g/dL (ref 3.5–5.0)
Anion gap: 8 (ref 5–15)
BUN: 12 mg/dL (ref 6–20)
CO2: 23 mmol/L (ref 22–32)
Calcium: 8.9 mg/dL (ref 8.9–10.3)
Chloride: 108 mmol/L (ref 98–111)
Creatinine, Ser: 0.67 mg/dL (ref 0.44–1.00)
Glucose, Bld: 86 mg/dL (ref 70–99)
Potassium: 3.9 mmol/L (ref 3.5–5.1)
SODIUM: 139 mmol/L (ref 135–145)
Total Bilirubin: 0.7 mg/dL (ref 0.3–1.2)
Total Protein: 7.5 g/dL (ref 6.5–8.1)

## 2018-09-14 LAB — LIPASE, BLOOD: Lipase: 21 U/L (ref 11–51)

## 2018-09-14 MED ORDER — LEVOFLOXACIN 750 MG PO TABS
750.0000 mg | ORAL_TABLET | Freq: Once | ORAL | Status: AC
  Administered 2018-09-14: 750 mg via ORAL

## 2018-09-14 MED ORDER — ONDANSETRON 4 MG PO TBDP: 4.0000 mg | ORAL_TABLET | Freq: Three times a day (TID) | ORAL | 0 refills | Status: DC | PRN

## 2018-09-14 MED ORDER — LEVOFLOXACIN 750 MG PO TABS: 750.0000 mg | ORAL_TABLET | Freq: Every day | ORAL | 0 refills | Status: DC

## 2018-09-14 MED ORDER — LEVOFLOXACIN 500 MG PO TABS
500.0000 mg | ORAL_TABLET | Freq: Once | ORAL | Status: DC
  Filled 2018-09-14: qty 1

## 2018-09-14 MED ORDER — LEVOFLOXACIN 750 MG PO TABS
ORAL_TABLET | ORAL | Status: AC
  Filled 2018-09-14: qty 1

## 2018-09-14 MED ORDER — ONDANSETRON 4 MG PO TBDP
4.0000 mg | ORAL_TABLET | Freq: Once | ORAL | Status: AC
  Administered 2018-09-14: 4 mg via ORAL
  Filled 2018-09-14: qty 1

## 2018-09-14 NOTE — ED Notes (Signed)
Pt to room 36 - lab work is complete. Here for abd pain with n/v. Pt comfortable at this time.

## 2018-09-14 NOTE — ED Notes (Signed)
Pt transported to CT ?

## 2018-09-14 NOTE — ED Triage Notes (Signed)
Pt c/o RLQ pain with N/V since yesterday. Denies diarrhea. [facility] and concerned about appendix

## 2018-09-14 NOTE — ED Provider Notes (Signed)
[facility] Emergency Department Provider Note       Time seen: ----------------------------------------- 5:14 PM on 09/14/2018 -----------------------------------------   I have reviewed the triage vital signs and the nursing notes.  HISTORY   Chief Complaint Abdominal Pain    HPI Joanna Higgins is a 17 y.o. female with a history of vascular malformation, irregular menstrual bleeding who presents to the ED for abdominal pain with nausea and vomiting.  Patient was complaining of some right lower quadrant pain and [facility].  She denies fevers, chills or other complaints.  Past Medical History:  Diagnosis Date  . Bladder instability   . Irregular menstrual bleeding 02/10/2016  . Scoliosis   . Vascular malformation     Patient Active Problem List   Diagnosis Date Noted  . Nexplanon insertion 07/25/2018  . Encounter for IUD insertion 02/18/2016  . Irregular menstrual bleeding 02/10/2016  . Learning disability 08/22/2015  . ADD (attention deficit disorder) 08/22/2015  . 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 DUB (dysfunctional uterine bleeding) 12/03/2013  . Anemia 12/03/2013  . Scoliosis 04/28/2013  . Adiposity 10/19/2012  . 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 UPPER ENDOSCOPY W/ SCLEROTHERAPY      Allergies Estrogens and Hydrocodone  Social History Social History   Tobacco Use  . Smoking status: Current Some Day Smoker    Types: E-cigarettes  . Smokeless tobacco: Never Used  Substance Use Topics  . Alcohol use: No    Alcohol/week: 0.0 standard drinks  . Drug use: No   Review of Systems Constitutional: Negative for fever. Cardiovascular: Negative for chest pain. Respiratory: Negative for shortness of breath. Gastrointestinal: Positive for abdominal  pain, nausea vomiting Genitourinary: Negative for dysuria. Musculoskeletal: Negative for back pain. Skin: Negative for rash. Neurological: Negative for headaches, focal weakness or numbness.  All systems negative/normal/unremarkable except as stated in the HPI  ____________________________________________   PHYSICAL EXAM:  VITAL SIGNS: ED Triage Vitals [09/14/18 1514]  Enc Vitals Group     BP 112/64     Pulse Rate 72     Resp 17     Temp 99 F (37.2 C)     Temp Source Oral     SpO2 95 %     Weight 190 lb (86.2 kg)     Height 5\' 3"  (1.6 m)     Head Circumference      Peak Flow      Pain Score 6     Pain Loc      Pain Edu?      Excl. in GC?    Constitutional: Alert and oriented. Well appearing and in no distress. Eyes: Conjunctivae are normal. Normal extraocular movements. Cardiovascular: Normal rate, regular rhythm. No murmurs, rubs, or gallops. Respiratory: Normal respiratory effort without tachypnea nor retractions. Breath sounds are clear and equal bilaterally. No wheezes/rales/rhonchi. Gastrointestinal: Right lower quadrant tenderness, no rebound or guarding.  Normal bowel sounds. Musculoskeletal: Nontender with normal range of motion in extremities. No lower extremity tenderness nor edema. Neurologic:  Normal speech and language. No gross focal neurologic deficits are appreciated.  Skin:  Skin is warm, dry and intact. No rash noted. Psychiatric: Mood and affect are normal. Speech and behavior are normal.   ____________________________________________  ED COURSE:  As part of my medical decision making, I reviewed the following data within the electronic MEDICAL RECORD NUMBER  History obtained from family if available, nursing notes, old chart and ekg, as well as notes from prior ED visits. Patient presented for right lower quadrant pain and vomiting we will assess with labs and imaging as indicated at this time.   Procedures ____________________________________________    LABS (pertinent positives/negatives)  Labs Reviewed  CBC - Abnormal; Notable for the following components:      Result Value   RDW 15.6 (*)    All other components within normal limits  URINALYSIS, COMPLETE (UACMP) WITH MICROSCOPIC - Abnormal; Notable for the following components:   Color, Urine YELLOW (*)    APPearance CLOUDY (*)    Hgb urine dipstick LARGE (*)    Ketones, ur 5 (*)    Protein, ur 30 (*)    Leukocytes, UA TRACE (*)    RBC / HPF >50 (*)    All other components within normal limits  LIPASE, BLOOD  COMPREHENSIVE METABOLIC PANEL  POC URINE PREG, ED  POCT PREGNANCY, URINE    RADIOLOGY Images were viewed by me  CT renal protocol IMPRESSION: 1. Areas of patchy infiltrate, felt to represent pneumonia, in the lung bases bilaterally.  2. No evident renal or ureteral calculus. No hydronephrosis on either side.  3. No evident bowel obstruction. No abscess in the abdomen or pelvis. Appendix appears normal.  ____________________________________________  DIFFERENTIAL DIAGNOSIS   Renal colic, UTI, pyelonephritis, appendicitis, ovarian cyst  FINAL ASSESSMENT AND PLAN  UTI, abdominal pain and vomiting   Plan: The patient had presented for dental pain and vomiting. Patient's labs are unremarkable regarding her blood work, her urine did reveal . Patient's imaging did not reveal any kidney stone and the appendix was normal.  There were areas of patchy infiltrates in the bases of her lungs.  Likely the best antibiotic coverage for her would be Levaquin. She is cleared for outpatient follow up.    Ulice Dash, MD   Note: This note was generated in part or whole with voice recognition software. Voice recognition is usually quite accurate but there are transcription errors that can and very often do occur. I apologize for any typographical errors that were not detected and corrected.     Emily Filbert, MD 09/14/18 1728

## 2018-09-14 NOTE — Telephone Encounter (Signed)
She may have a work note

## 2018-09-14 NOTE — Telephone Encounter (Signed)
Patient is requesting a work note, patient missed work yesterday due to being congested, Also patient is currently going to urgent care to be treated. Please advise.

## 2018-09-15 ENCOUNTER — Encounter: Payer: Self-pay | Admitting: Family Medicine

## 2018-09-15 NOTE — Telephone Encounter (Signed)
Pt contacted and informed note is upfront and ready for pick up.

## 2018-09-19 ENCOUNTER — Telehealth: Payer: Self-pay | Admitting: Family Medicine

## 2018-09-19 NOTE — Telephone Encounter (Signed)
Contacted patient and patient has set up an appt for tomorrow.

## 2018-09-19 NOTE — Telephone Encounter (Signed)
Patient seen in ER recently with cystitis and UTI and pneumonia. Patient states she has finished the medication the ER gave her ut is still having abdominal pain and blood in urine- No fever-Please advise.

## 2018-09-19 NOTE — Telephone Encounter (Signed)
Pt requesting appointment for tomorrow  Recent kidney stone & still having blood in urine & abd pain  Please advise (pt notified we are short a nurse & is expecting a call back)

## 2018-09-19 NOTE — Telephone Encounter (Signed)
May have appointment for tomorrow based upon available slots

## 2018-09-20 ENCOUNTER — Encounter: Payer: Self-pay | Admitting: Family Medicine

## 2018-09-20 ENCOUNTER — Ambulatory Visit (INDEPENDENT_AMBULATORY_CARE_PROVIDER_SITE_OTHER): Payer: 59 | Admitting: Family Medicine

## 2018-09-20 VITALS — BP 110/72 | Temp 98.6°F | Ht 63.0 in | Wt 196.0 lb

## 2018-09-20 DIAGNOSIS — N12 Tubulo-interstitial nephritis, not specified as acute or chronic: Secondary | ICD-10-CM | POA: Diagnosis not present

## 2018-09-20 DIAGNOSIS — R300 Dysuria: Secondary | ICD-10-CM | POA: Diagnosis not present

## 2018-09-20 DIAGNOSIS — J189 Pneumonia, unspecified organism: Secondary | ICD-10-CM

## 2018-09-20 LAB — POCT URINALYSIS DIPSTICK
PH UA: 5 (ref 5.0–8.0)
Spec Grav, UA: 1.025 (ref 1.010–1.025)

## 2018-09-20 MED ORDER — CEFPROZIL 500 MG PO TABS: 500.0000 mg | ORAL_TABLET | Freq: Two times a day (BID) | ORAL | 0 refills | Status: DC

## 2018-09-20 MED ORDER — TRAMADOL HCL 50 MG PO TABS: 50.0000 mg | ORAL_TABLET | Freq: Three times a day (TID) | ORAL | 0 refills | Status: DC | PRN

## 2018-09-20 NOTE — Progress Notes (Signed)
   Subjective:    Patient ID: Joanna Higgins, female    DOB: [birth year], 17 y.o.   MRN: [id number]  HPI  Patient is here today with complaints of having blood in her urine after a recent kidney infection. states she also has a cough and headache.She states she went to the hospital in [place] last week for burning,stinging,abd pain. She states they gave her Levaquin for a UTI ( She has finishedit)and wrote her out of work.She says they told her also that she was on the edge of pneumonia.  I reviewed over her CAT scan that showed some markings that could be consistent with early pneumonia Unfortunately patient has not got better with the antibiotics still has lower pelvic pain and flank pain denies hematuria states urine is discolored foul-smelling she denies high fever chills she does relate a lot of fatigue and tiredness Results for orders placed or performed in visit on 09/20/18  POCT urinalysis dipstick  Result Value Ref Range   Color, UA     Clarity, UA     Glucose, UA     Bilirubin, UA     Ketones, UA     Spec Grav, UA 1.025 1.010 - 1.025   Blood, UA 2+    pH, UA 5.0 5.0 - 8.0   Protein, UA     Urobilinogen, UA     Nitrite, UA     Leukocytes, UA Moderate (2+) (A) Negative   Appearance     Odor       Review of Systems  Constitutional: Negative for activity change, appetite change and fatigue.  HENT: Negative for congestion and rhinorrhea.   Respiratory: Negative for cough and shortness of breath.   Cardiovascular: Negative for chest pain and leg swelling.  Gastrointestinal: Positive for abdominal pain and nausea. Negative for diarrhea.  Endocrine: Negative for polydipsia and polyphagia.  Genitourinary: Positive for frequency and urgency.  Skin: Negative for color change.  Neurological: Negative for dizziness and weakness.  Psychiatric/Behavioral: Negative for behavioral problems and confusion.       Objective:   Physical Exam  Constitutional: She appears  well-nourished. No distress.  HENT:  Head: Normocephalic and atraumatic.  Eyes: Right eye exhibits no discharge. Left eye exhibits no discharge.  Neck: No tracheal deviation present.  Cardiovascular: Normal rate, regular rhythm and normal heart sounds.  No murmur heard. Pulmonary/Chest: Effort normal and breath sounds normal. No respiratory distress.  Abdominal: Soft. There is tenderness.  Musculoskeletal: She exhibits no edema.  Lymphadenopathy:    She has no cervical adenopathy.  Neurological: She is alert. Coordination normal.  Skin: Skin is warm and dry.  Psychiatric: She has a normal mood and affect. Her behavior is normal.  Vitals reviewed.  Some lower pelvic tenderness no guarding or rebound  I did discuss this case with her mother by the patient's consent to follow-up if further trouble I believe the bigger problem is bladder involvement with the involvement I doubt pneumonia is much of an issue certainly if the patient gets worse follow-up immediately   Patient not toxic  Assessment & Plan:  Pyelonephritis along with UTI Send for culture Await findings Repeat if ongoing troubles in regards to lab work but currently not necessary Warning signs discussed in detail Stay out of work the next few days Return to work Monday Early pneumonia per CT scan of the ER On clinical exam I do not appreciate any crackles currently

## 2018-09-20 NOTE — Patient Instructions (Signed)
Take Cefzil one 2 times a day for the next 10 days  No work  Through Saturday  If fevers or worse call  Next week if not getting better please call

## 2018-09-22 LAB — SPECIMEN STATUS REPORT

## 2018-09-22 LAB — URINE CULTURE

## 2018-10-12 ENCOUNTER — Encounter: Payer: Self-pay | Admitting: Family Medicine

## 2018-10-12 ENCOUNTER — Ambulatory Visit (INDEPENDENT_AMBULATORY_CARE_PROVIDER_SITE_OTHER): Payer: 59 | Admitting: Family Medicine

## 2018-10-12 ENCOUNTER — Ambulatory Visit (HOSPITAL_COMMUNITY)
Admission: RE | Admit: 2018-10-12 | Discharge: 2018-10-12 | Disposition: A | Discharge status: Home / Self Care | Payer: 59 | Source: Ambulatory Visit | Attending: Family Medicine | Admitting: Family Medicine

## 2018-10-12 VITALS — BP 112/80 | Temp 98.8°F | Ht 63.0 in | Wt 196.1 lb

## 2018-10-12 DIAGNOSIS — R300 Dysuria: Secondary | ICD-10-CM | POA: Diagnosis not present

## 2018-10-12 DIAGNOSIS — R319 Hematuria, unspecified: Secondary | ICD-10-CM

## 2018-10-12 DIAGNOSIS — R109 Unspecified abdominal pain: Secondary | ICD-10-CM | POA: Insufficient documentation

## 2018-10-12 LAB — POCT URINALYSIS DIPSTICK
SPEC GRAV UA: 1.02 (ref 1.010–1.025)
pH, UA: 5 (ref 5.0–8.0)

## 2018-10-12 MED ORDER — NITROFURANTOIN MONOHYD MACRO 100 MG PO CAPS: 100.0000 mg | ORAL_CAPSULE | Freq: Two times a day (BID) | ORAL | 0 refills | Status: AC

## 2018-10-12 MED ORDER — TRAMADOL HCL 50 MG PO TABS: 50.0000 mg | ORAL_TABLET | Freq: Three times a day (TID) | ORAL | 0 refills | Status: DC | PRN

## 2018-10-12 NOTE — Progress Notes (Signed)
   Subjective:    Patient ID: Joanna Higgins, female    DOB: [birth year], 17 y.o.   MRN: [id number]  HPI  Patient states she is still having blood in her urine and thinks she still has a uti. She states she is not experiencing any burning or stinging,but can visible see blood in her urine. Patient relates intermittent hematuria Also denies any burning or stinging Does have history of kidney stones He is having some intermittent abdominal pain Denies any other particular troubles  Review of Systems Results for orders placed or performed in visit on 10/12/18  POCT urinalysis dipstick  Result Value Ref Range   Color, UA     Clarity, UA     Glucose, UA     Bilirubin, UA     Ketones, UA     Spec Grav, UA 1.020 1.010 - 1.025   Blood, UA Postive    pH, UA 5.0 5.0 - 8.0   Protein, UA     Urobilinogen, UA     Nitrite, UA     Leukocytes, UA Trace (A) Negative   Appearance     Odor    Patient relates hematuria issues intermittently over the past week and a half was on her cycle last week but states that is over with     Objective:   Physical Exam Lungs are clear respiratory rate normal flank nontender abdomen soft subjective discomfort in the mid and lower pelvic region extremities no edema skin warm dry heart regular no murmurs       Assessment & Plan:  Hematuria Sent for culture Antibiotic prescribed Possibility kidney stone KUB Tramadol if necessary for pain Await the results of the x-ray If persistent trouble will need CT scan if that does not reveal the issue then the next step would be [facility] with cystoscope

## 2018-10-13 ENCOUNTER — Other Ambulatory Visit: Payer: Self-pay | Admitting: *Deleted

## 2018-10-13 ENCOUNTER — Telehealth: Payer: Self-pay | Admitting: Family Medicine

## 2018-10-13 DIAGNOSIS — R319 Hematuria, unspecified: Secondary | ICD-10-CM

## 2018-10-13 MED ORDER — TAMSULOSIN HCL 0.4 MG PO CAPS: ORAL_CAPSULE | ORAL | 0 refills | Status: DC

## 2018-10-13 NOTE — Telephone Encounter (Signed)
Mother calling requesting to speak with nurse to go over test results. DPR on file.

## 2018-10-13 NOTE — Telephone Encounter (Signed)
Discussed results with mother. Mother states she wants to cancel appt for Monday and will call back on Monday if she is not doing better and get an appt at that time. She wants to give the flomax time to see if it works. Will call back on Monday if having abdominal pain or blood in urine.

## 2018-10-14 LAB — URINE CULTURE

## 2018-10-16 ENCOUNTER — Ambulatory Visit: Payer: 59 | Admitting: Family Medicine

## 2018-10-23 ENCOUNTER — Telehealth: Payer: Self-pay | Admitting: Family Medicine

## 2018-10-23 ENCOUNTER — Other Ambulatory Visit: Payer: Self-pay | Admitting: *Deleted

## 2018-10-23 ENCOUNTER — Encounter: Payer: Self-pay | Admitting: Family Medicine

## 2018-10-23 DIAGNOSIS — R319 Hematuria, unspecified: Secondary | ICD-10-CM

## 2018-10-23 DIAGNOSIS — R109 Unspecified abdominal pain: Secondary | ICD-10-CM

## 2018-10-23 DIAGNOSIS — N898 Other specified noninflammatory disorders of vagina: Secondary | ICD-10-CM

## 2018-10-23 NOTE — Telephone Encounter (Signed)
Blood in urine stopped but came back yesterday, lower abdominal pain on both sides, yellow discharge when wiping, no fever, vomited twice 2 days ago. Abdominal pain has never went away, not worse the same as when seen.

## 2018-10-23 NOTE — Telephone Encounter (Signed)
Pt called requesting to speak with Dr. Lorin Picket as to why she is still having blood in her urine. She stated it was light. Enid Derry has sent over her referral for urology to [facility] this morning. Pt was seen on 10/12/2018.

## 2018-10-23 NOTE — Telephone Encounter (Signed)
I recommend that the patient also have an appointment with gynecology or her nurse practitioner for a female exam please talk with her see what she would like to do in regards to that Please have family be made aware of the urology referral hopefully they will hear something within a week but she will need to see [facility] If possible please make referral urgent-patient likely to have ongoing troubles without relatively soon appointment thank you

## 2018-10-23 NOTE — Telephone Encounter (Signed)
Discussed with pt. Pt states she does not have a gyn. Urgent referral put in for gyn. Pt has not heard back from urology. Read referral notes that referral was sent and urology office should be contacting pt and for her to call their office to follow up with them.  Pt notified to call back if symptoms are worse.

## 2018-10-23 NOTE — Progress Notes (Signed)
amb ref °

## 2018-10-26 ENCOUNTER — Ambulatory Visit: Payer: 59 | Admitting: Women's Health

## 2018-11-07 ENCOUNTER — Ambulatory Visit (INDEPENDENT_AMBULATORY_CARE_PROVIDER_SITE_OTHER): Payer: 59 | Admitting: Women's Health

## 2018-11-07 ENCOUNTER — Encounter: Payer: Self-pay | Admitting: Women's Health

## 2018-11-07 VITALS — BP 107/68 | HR 102 | Ht 63.0 in | Wt 197.8 lb

## 2018-11-07 DIAGNOSIS — R319 Hematuria, unspecified: Secondary | ICD-10-CM | POA: Diagnosis not present

## 2018-11-07 LAB — POCT URINALYSIS DIPSTICK
Glucose, UA: NEGATIVE
Ketones, UA: NEGATIVE
Nitrite, UA: NEGATIVE
Protein, UA: NEGATIVE

## 2018-11-07 NOTE — Progress Notes (Signed)
   GYN VISIT Patient name: Joanna Higgins MRN [id number]  Date of birth: 06/16/2001 Chief Complaint:   Hematuria  History of Present Illness:   Joanna Higgins is a 17 y.o. G0P0000 Caucasian female being seen today for hematuria x 2wks. Can't describe it, thinks it may be light. Some increased frequency. No dysuria. Has had uti before, doesn't feel like this is uti. Denies flank pain. Some mild lower abdominal pain. No h/o kidney stones.    Patient's last menstrual period was 10/30/2018. The current method of family planning is Nexplanon. Last pap <21yo. Results were:  n/a Review of Systems:   Pertinent items are noted in HPI Denies fever/chills, dizziness, headaches, visual disturbances, fatigue, shortness of breath, chest pain, abdominal pain, vomiting, abnormal vaginal discharge/itching/odor/irritation, problems with periods, bowel movements, urination, or intercourse unless otherwise stated above.  Pertinent History Reviewed:  Reviewed past medical,surgical, social, obstetrical and family history.  Reviewed problem list, medications and allergies. Physical Assessment:   Vitals:   11/07/18 1342  BP: 107/68  Pulse: (!) 102  Weight: 197 lb 12.8 oz (89.7 kg)  Height: 5\' 3"  (1.6 m)  Body mass index is 35.04 kg/m.       Physical Examination:   General appearance: alert, well appearing, and in no distress  Mental status: alert, oriented to person, place, and time  Skin: warm & dry   Cardiovascular: normal heart rate noted  Respiratory: normal respiratory effort, no distress  Abdomen: soft, non-tender   Pelvic: examination not indicated  Extremities: no edema   Results for orders placed or performed in visit on 11/07/18 (from the past 24 hour(s))  POCT Urinalysis Dipstick   Collection Time: 11/07/18  1:51 PM  Result Value Ref Range   Color, UA     Clarity, UA     Glucose, UA Negative Negative   Bilirubin, UA     Ketones, UA neg    Spec Grav, UA     Blood, UA large    pH,  UA     Protein, UA Negative Negative   Urobilinogen, UA     Nitrite, UA neg    Leukocytes, UA Large (3+) (A) Negative   Appearance     Odor      Assessment & Plan:  1) Hematuria> will send urine cx   Meds: No orders of the defined types were placed in this encounter.   Orders Placed This Encounter  Procedures  . Urine Culture  . POCT Urinalysis Dipstick    No follow-ups on file.  Cheral MarkerKimberly R Genean Adamski CNM, [facility]-BC 11/07/2018 2:19 PM

## 2018-11-14 ENCOUNTER — Telehealth: Payer: Self-pay | Admitting: Women's Health

## 2018-11-14 ENCOUNTER — Encounter: Payer: Self-pay | Admitting: *Deleted

## 2018-11-14 NOTE — Telephone Encounter (Signed)
Patient called, checking on lab results, had them done last week.  [facility]  [phone]

## 2018-11-14 NOTE — Telephone Encounter (Signed)
Unable to reach patient. Calling restriction on phone. Will send mychart message.

## 2018-11-15 ENCOUNTER — Telehealth: Payer: Self-pay | Admitting: Women's Health

## 2018-11-15 NOTE — Telephone Encounter (Signed)
Patient called stating that she would like to know the results of her pap. Please contact pt °

## 2018-11-15 NOTE — Telephone Encounter (Signed)
Attempted to return pts call. Pt not home. Left message for pt to call us back

## 2018-11-16 ENCOUNTER — Other Ambulatory Visit: Payer: 59

## 2018-11-16 ENCOUNTER — Telehealth: Payer: Self-pay | Admitting: *Deleted

## 2018-11-16 NOTE — Telephone Encounter (Signed)
Spoke with pt. Pt didn't leave much urine when she was seen on 11/07/18. I advised she must not have had enough urine for a urine culture. Pt to come by today and leave another sample. Will send to lab for culture. Advised to drink plenty of water before coming. Pt voiced understanding. JSY

## 2018-11-18 LAB — URINE CULTURE

## 2018-11-20 ENCOUNTER — Telehealth: Payer: Self-pay | Admitting: Women's Health

## 2018-11-20 NOTE — Telephone Encounter (Signed)
Needs a nurse to call her with urine results/

## 2018-11-20 NOTE — Telephone Encounter (Signed)
Pt aware urine culture showed nothing that needs treatment. Pt voiced understanding. JSY

## 2018-12-07 ENCOUNTER — Ambulatory Visit: Payer: 59 | Admitting: Family Medicine

## 2018-12-11 ENCOUNTER — Encounter: Payer: Self-pay | Admitting: Family Medicine

## 2018-12-19 ENCOUNTER — Ambulatory Visit: Payer: Medicaid Other | Admitting: Family Medicine

## 2018-12-22 ENCOUNTER — Ambulatory Visit (INDEPENDENT_AMBULATORY_CARE_PROVIDER_SITE_OTHER): Payer: Medicaid Other | Admitting: Family Medicine

## 2018-12-22 ENCOUNTER — Encounter: Payer: Self-pay | Admitting: Family Medicine

## 2018-12-22 VITALS — Temp 98.6°F | Wt 198.2 lb

## 2018-12-22 DIAGNOSIS — R300 Dysuria: Secondary | ICD-10-CM

## 2018-12-22 DIAGNOSIS — F439 Reaction to severe stress, unspecified: Secondary | ICD-10-CM

## 2018-12-22 DIAGNOSIS — N3001 Acute cystitis with hematuria: Secondary | ICD-10-CM

## 2018-12-22 LAB — POCT URINALYSIS DIPSTICK
PH UA: 5 (ref 5.0–8.0)
Spec Grav, UA: >=1.03 — AB (ref 1.010–1.025)
UROBILINOGEN UA: 1 U/dL

## 2018-12-22 MED ORDER — CEFDINIR 300 MG PO CAPS: 300.0000 mg | ORAL_CAPSULE | Freq: Two times a day (BID) | ORAL | 0 refills | Status: DC

## 2018-12-22 NOTE — Progress Notes (Signed)
   Subjective:    Patient ID: Joanna Higgins, female    DOB: [birth year], 17 y.o.   MRN: [id number]  Hematuria  This is a new problem. The current episode started 1 to 4 weeks ago. Irritative symptoms include frequency. painful urination. Associated symptoms include abdominal pain and flank pain. Pertinent negatives include no fever. (Stomach pain when eating, each night she gets a headache and that makes her stomach hurt)  pt states she has an implant for birth control and think the bleeding is from that. Pt sees Gyn on 12/26/18. Pt took pregnancy test last weekend and came up negative.  Patient is following through with gynecology The patient at times feels down but denies being depressed she is just frustrated She does not want to hurt herself and denies being suicidal Results for orders placed or performed in visit on 12/22/18  POCT Urinalysis Dipstick  Result Value Ref Range   Color, UA     Clarity, UA     Glucose, UA     Bilirubin, UA +    Ketones, UA     Spec Grav, UA >=1.030 (A) 1.010 - 1.025   Blood, UA +    pH, UA 5.0 5.0 - 8.0   Protein, UA     Urobilinogen, UA 1.0 0.2 or 1.0 E.U./dL   Nitrite, UA     Leukocytes, UA Moderate (2+) (A) Negative   Appearance     Odor      Review of Systems  Constitutional: Negative for activity change and fever.  HENT: Negative for congestion, ear pain and rhinorrhea.   Eyes: Negative for discharge.  Respiratory: Negative for cough, shortness of breath and wheezing.   Cardiovascular: Negative for chest pain.  Gastrointestinal: Positive for abdominal pain.  Genitourinary: Positive for flank pain, frequency and hematuria.       Objective:   Physical Exam Vitals signs and nursing note reviewed.  Constitutional:      Appearance: She is well-developed.  HENT:     Head: Normocephalic.     Nose: Nose normal.     Mouth/Throat:     Pharynx: No oropharyngeal exudate.  Neck:     Musculoskeletal: Neck supple.  Cardiovascular:     Rate  and Rhythm: Normal rate.     Heart sounds: Normal heart sounds. No murmur.  Pulmonary:     Effort: Pulmonary effort is normal.     Breath sounds: Normal breath sounds. No wheezing.  Lymphadenopathy:     Cervical: No cervical adenopathy.  Skin:    General: Skin is warm and dry.           Assessment & Plan:  UTI-antibiotic prescribed warning signs discussed follow-up ongoing trouble  Stress with mild depression recommend counseling patient denies being suicidal.  Just stressed out about not having many friends and stressed in life denies being suicidal warning signs discussed with patient

## 2018-12-26 ENCOUNTER — Encounter: Payer: Self-pay | Admitting: Advanced Practice Midwife

## 2018-12-26 ENCOUNTER — Ambulatory Visit (INDEPENDENT_AMBULATORY_CARE_PROVIDER_SITE_OTHER): Payer: Managed Care, Other (non HMO) | Admitting: Advanced Practice Midwife

## 2018-12-26 ENCOUNTER — Other Ambulatory Visit: Payer: Self-pay

## 2018-12-26 VITALS — BP 100/65 | HR 98 | Ht 63.0 in | Wt 196.0 lb

## 2018-12-26 DIAGNOSIS — N911 Secondary amenorrhea: Secondary | ICD-10-CM

## 2018-12-26 DIAGNOSIS — T50905A Adverse effect of unspecified drugs, medicaments and biological substances, initial encounter: Secondary | ICD-10-CM | POA: Diagnosis not present

## 2018-12-26 NOTE — Progress Notes (Signed)
[facility] Visit  Patient name: Joanna Higgins MRN [id number]  Date of birth: [birth year]  CC & HPI:  Joanna Higgins is a 17 y.o. Caucasian female presenting today for no period since getting nexplanon.  Explained that this is normal Also c/o lower abdominal pain. For about 2 weeks. Started on ABX for UIT 12/27.  Read up on Nexplnaon and thinks these could be SE>  Also states that she misses her period.  Discussed that only paragard (can't do estrogen) would give her a period.    Pertinent History Reviewed:  Medical & Surgical Hx:   Past Medical History:  Diagnosis Date  . Bladder instability   . Irregular menstrual bleeding 02/10/2016  . Scoliosis   . Vascular malformation    Past Surgical History:  Procedure Laterality Date  . UPPER ENDOSCOPY W/ SCLEROTHERAPY     Family History  Problem Relation Age of Onset  . Arthritis Mother   . Arthritis Father   . Arthritis Maternal Grandmother   . Diabetes Maternal Grandfather   . Arthritis Maternal Grandfather   . Arthritis Paternal Grandfather     Current Outpatient Medications:  .  cefdinir (OMNICEF) 300 MG capsule, Take 1 capsule (300 mg total) by mouth 2 (two) times daily., Disp: 14 capsule, Rfl: 0 Social History: Reviewed -  reports that she has been smoking e-cigarettes. She has never used smokeless tobacco.  Review of Systems:   Constitutional: Negative for fever and chills Eyes: Negative for visual disturbances Respiratory: Negative for shortness of breath, dyspnea Cardiovascular: Negative for chest pain or palpitations  Gastrointestinal: Negative for vomiting, diarrhea and constipation; no abdominal pain Genitourinary: Negative for dysuria and urgency, vaginal irritation or itching Musculoskeletal: Negative for back pain, joint pain, myalgias  Neurological: Negative for dizziness and headaches    Objective Findings:    Physical Examination: Vitals:   12/26/18 1344  BP: 100/65  Pulse: 98   General  appearance - well appearing, and in no distress Mental status - alert, oriented to person, place, and time Chest:  Normal respiratory effort Heart - normal rate and regular rhythm Pelvic: deferred.  Musculoskeletal:  Normal range of motion without pain Extremities:  No edema    No results found for this or any previous visit (from the past 24 hour(s)).    Assessment & Plan:  A:   Amenorrhea 2/2 BC P:  Wants to keep implant  Finish abx for UTI as prescribed.  Has fu already scheduled w/Dr. Gerda DissLuking   No follow-ups on file.  [name] CNM 12/26/2018 1:51 PM

## 2019-01-04 ENCOUNTER — Encounter: Payer: Self-pay | Admitting: Family Medicine

## 2019-01-18 ENCOUNTER — Ambulatory Visit: Payer: Managed Care, Other (non HMO) | Admitting: Family Medicine

## 2019-02-01 ENCOUNTER — Other Ambulatory Visit (HOSPITAL_COMMUNITY)
Admission: RE | Admit: 2019-02-01 | Discharge: 2019-02-01 | Disposition: A | Discharge status: Home / Self Care | Payer: 59 | Source: Ambulatory Visit | Attending: Family Medicine | Admitting: Family Medicine

## 2019-02-01 ENCOUNTER — Encounter: Payer: Self-pay | Admitting: Family Medicine

## 2019-02-01 ENCOUNTER — Ambulatory Visit (INDEPENDENT_AMBULATORY_CARE_PROVIDER_SITE_OTHER): Payer: Managed Care, Other (non HMO) | Admitting: Family Medicine

## 2019-02-01 VITALS — BP 110/68 | Temp 102.8°F | Ht 63.0 in | Wt 195.0 lb

## 2019-02-01 DIAGNOSIS — J019 Acute sinusitis, unspecified: Secondary | ICD-10-CM

## 2019-02-01 DIAGNOSIS — J111 Influenza due to unidentified influenza virus with other respiratory manifestations: Secondary | ICD-10-CM | POA: Diagnosis not present

## 2019-02-01 DIAGNOSIS — R1084 Generalized abdominal pain: Secondary | ICD-10-CM | POA: Diagnosis not present

## 2019-02-01 LAB — CBC WITH DIFFERENTIAL/PLATELET
Abs Immature Granulocytes: 0.01 10*3/uL (ref 0.00–0.07)
BASOS PCT: 1 %
Basophils Absolute: 0 10*3/uL (ref 0.0–0.1)
EOS ABS: 0 10*3/uL (ref 0.0–0.5)
EOS PCT: 1 %
HCT: 43.5 % (ref 36.0–46.0)
HEMOGLOBIN: 13.7 g/dL (ref 12.0–15.0)
Immature Granulocytes: 0 %
LYMPHS PCT: 17 %
Lymphs Abs: 0.9 10*3/uL (ref 0.7–4.0)
MCH: 28.5 pg (ref 26.0–34.0)
MCHC: 31.5 g/dL (ref 30.0–36.0)
MCV: 90.6 fL (ref 80.0–100.0)
Monocytes Absolute: 0.7 10*3/uL (ref 0.1–1.0)
Monocytes Relative: 13 %
Neutro Abs: 3.8 10*3/uL (ref 1.7–7.7)
Neutrophils Relative %: 68 %
Platelets: 185 10*3/uL (ref 150–400)
RBC: 4.8 MIL/uL (ref 3.87–5.11)
RDW: 13.5 % (ref 11.5–15.5)
WBC: 5.5 10*3/uL (ref 4.0–10.5)
nRBC: 0 % (ref 0.0–0.2)

## 2019-02-01 LAB — HEPATIC FUNCTION PANEL
ALK PHOS: 55 U/L (ref 38–126)
ALT: 17 U/L (ref 0–44)
AST: 18 U/L (ref 15–41)
Albumin: 4.3 g/dL (ref 3.5–5.0)
BILIRUBIN INDIRECT: 0.3 mg/dL (ref 0.3–0.9)
Bilirubin, Direct: 0.1 mg/dL (ref 0.0–0.2)
TOTAL PROTEIN: 7.6 g/dL (ref 6.5–8.1)
Total Bilirubin: 0.4 mg/dL (ref 0.3–1.2)

## 2019-02-01 LAB — BASIC METABOLIC PANEL
Anion gap: 8 (ref 5–15)
BUN: 11 mg/dL (ref 6–20)
CO2: 23 mmol/L (ref 22–32)
Calcium: 8.8 mg/dL — ABNORMAL LOW (ref 8.9–10.3)
Chloride: 103 mmol/L (ref 98–111)
Creatinine, Ser: 0.81 mg/dL (ref 0.44–1.00)
GFR calc Af Amer: >60 mL/min (ref >60)
GFR calc non Af Amer: >60 mL/min (ref >60)
Glucose, Bld: 95 mg/dL (ref 70–99)
Potassium: 3.9 mmol/L (ref 3.5–5.1)
Sodium: 134 mmol/L — ABNORMAL LOW (ref 135–145)

## 2019-02-01 LAB — LIPASE, BLOOD: Lipase: 23 U/L (ref 11–51)

## 2019-02-01 MED ORDER — AZITHROMYCIN 250 MG PO TABS: ORAL_TABLET | ORAL | 0 refills | Status: DC

## 2019-02-01 MED ORDER — ONDANSETRON 4 MG PO TBDP: 4.0000 mg | ORAL_TABLET | Freq: Three times a day (TID) | ORAL | 0 refills | Status: DC | PRN

## 2019-02-01 NOTE — Addendum Note (Signed)
Addended by: Jeannine Boga D on: 02/01/2019 01:17 PM   Modules accepted: Orders

## 2019-02-01 NOTE — Patient Instructions (Signed)

## 2019-02-01 NOTE — Progress Notes (Addendum)
   Subjective:    Patient ID: Joanna Higgins, female    DOB: [birth year], 18 y.o.   MRN: [id number]  Cough  This is a new problem. Episode onset: days. Associated symptoms include chills, a fever, headaches and myalgias. Associated symptoms comments: Vomiting, fever, chills, body aches.   Blurry vision since fever started.   Needs work excuse for leaving early yesterday and for days she will be out.   Saturday started with fever. Has had diarrhea x 1. Vomiting about 3x daily since then. Has had a fever everyday since. Also c/o cough and congestion. Cough is productive of yellow sputum. Reports chills and bodyaches. States can't keep anything down - even water.   Has tried tylenol, only taking at night time.   Review of Systems  Constitutional: Positive for activity change, appetite change, chills and fever.  HENT: Positive for congestion.   Respiratory: Positive for cough.   Gastrointestinal: Positive for abdominal pain, diarrhea and vomiting.  Musculoskeletal: Positive for myalgias.  Neurological: Positive for headaches.       Objective:   Physical Exam Vitals signs and nursing note reviewed.  Constitutional:      General: She is not in acute distress.    Appearance: She is not toxic-appearing.  HENT:     Head: Normocephalic and atraumatic.     Mouth/Throat:     Mouth: Mucous membranes are moist.     Pharynx: Oropharynx is clear.  Eyes:     General:        Right eye: No discharge.        Left eye: No discharge.  Neck:     Musculoskeletal: Neck supple. No neck rigidity.  Cardiovascular:     Rate and Rhythm: Normal rate and regular rhythm.     Heart sounds: Normal heart sounds.  Pulmonary:     Effort: Pulmonary effort is normal. No respiratory distress.     Breath sounds: Normal breath sounds. No wheezing or rales.  Abdominal:     General: Bowel sounds are normal. There is no distension.     Palpations: Abdomen is soft. There is no mass.     Tenderness: There is  generalized abdominal tenderness. There is no guarding.  Lymphadenopathy:     Cervical: No cervical adenopathy.  Skin:    General: Skin is warm and dry.  Neurological:     Mental Status: She is alert.       Assessment & Plan:  1. Influenza Discussed likely flu diagnosis based on her symptoms and presentation. She is past the time to treat with tamiflu. Discussed symptomatic care, taking tylenol or motrin throughout the day as directed for her fever. Zofran given to take prn for nausea. Discussed importance of hydration. We will obtain some lab work given ongoing fever and generalized abdominal pain, will notify of results and f/u based on results. Warning signs discussed. F/u if symptoms worsen or fail to improve.   2. Acute rhinosinusitis: Potential for secondary sinusitis, will cover for this with azithromycin.   3. Generalized abdominal pain - Plan: CBC with Differential/Platelet, Hepatic function panel, Basic metabolic panel, Lipase  See above.   ADDENDUM: All lab work came back reassuring. Will notify patient. Recommend we start on Z-pack to cover for potential secondary bacterial sinusitis given the duration of her fever.   Dr. Lilyan Punt was consulted on this case and is in agreement with the above treatment plan.

## 2019-02-14 ENCOUNTER — Ambulatory Visit: Payer: Managed Care, Other (non HMO) | Admitting: Family Medicine

## 2019-02-20 ENCOUNTER — Ambulatory Visit: Payer: Managed Care, Other (non HMO) | Admitting: Family Medicine

## 2019-04-02 ENCOUNTER — Telehealth: Payer: Self-pay | Admitting: Women's Health

## 2019-04-02 ENCOUNTER — Other Ambulatory Visit: Payer: Medicaid Other

## 2019-04-02 ENCOUNTER — Telehealth: Payer: Self-pay | Admitting: *Deleted

## 2019-04-02 NOTE — Telephone Encounter (Signed)
Pt states that she is having "pain in stomach and blood in urine". She would like something sent in for UTI. Advised that she could come by and leave a urine sample for [place] to send for culture, per Dr Despina Hidden. Advised if the culture came back positive, we would send in medication.

## 2019-04-02 NOTE — Telephone Encounter (Signed)
Pt would like to be worked in for blood in urine/ please advise

## 2019-04-03 ENCOUNTER — Other Ambulatory Visit: Payer: Self-pay

## 2019-04-03 ENCOUNTER — Other Ambulatory Visit: Payer: Medicaid Other

## 2019-04-04 ENCOUNTER — Other Ambulatory Visit: Payer: Medicaid Other

## 2019-04-05 ENCOUNTER — Other Ambulatory Visit: Payer: Medicaid Other

## 2019-04-09 ENCOUNTER — Other Ambulatory Visit: Payer: Medicaid Other

## 2019-04-09 ENCOUNTER — Telehealth: Payer: Self-pay | Admitting: Obstetrics & Gynecology

## 2019-04-09 NOTE — Telephone Encounter (Signed)
Spoke with patient she is coming in at 10 am for urine dip/culture.

## 2019-04-09 NOTE — Telephone Encounter (Signed)
Patient called, stated she has blood in her urine x 4 weeks.  Stated she is having a lot of stomach pain.  She would like to come in, please advise.  [facility]  [phone]

## 2019-04-09 NOTE — Telephone Encounter (Signed)
Please refer to the note from lat week where the patient was informed to come in provide a urine for urinalysis and culture  Please have patient do this, she did not do it last week  Based on the urine culture report I will decide if she needs to be seen

## 2019-04-10 ENCOUNTER — Other Ambulatory Visit: Payer: Self-pay

## 2019-04-10 ENCOUNTER — Other Ambulatory Visit: Payer: Medicaid Other

## 2019-04-10 ENCOUNTER — Other Ambulatory Visit: Payer: Self-pay | Admitting: Obstetrics & Gynecology

## 2019-04-10 DIAGNOSIS — Z331 Pregnant state, incidental: Secondary | ICD-10-CM

## 2019-04-10 DIAGNOSIS — R102 Pelvic and perineal pain: Secondary | ICD-10-CM

## 2019-04-10 DIAGNOSIS — Z1389 Encounter for screening for other disorder: Secondary | ICD-10-CM

## 2019-04-10 LAB — POCT URINALYSIS DIPSTICK OB
Glucose, UA: NEGATIVE
Ketones, UA: NEGATIVE
Leukocytes, UA: NEGATIVE
Nitrite, UA: NEGATIVE

## 2019-04-10 MED ORDER — CIPROFLOXACIN HCL 500 MG PO TABS: 500.0000 mg | ORAL_TABLET | Freq: Two times a day (BID) | ORAL | 0 refills | Status: DC

## 2019-04-10 NOTE — Progress Notes (Signed)
Urine culture sent. Trace protein, large blood.

## 2019-04-12 LAB — URINE CULTURE

## 2019-04-16 ENCOUNTER — Telehealth: Payer: Self-pay | Admitting: Advanced Practice Midwife

## 2019-04-16 ENCOUNTER — Telehealth: Payer: Self-pay | Admitting: Obstetrics & Gynecology

## 2019-04-16 ENCOUNTER — Telehealth: Payer: Self-pay | Admitting: *Deleted

## 2019-04-16 NOTE — Telephone Encounter (Signed)
Patient called for urine cultures results and I informed her culture was negative.  States she is still seeing blood in her urine and having abdominal pain.  Please advise.

## 2019-04-16 NOTE — Telephone Encounter (Signed)
Patient called, stated she had a urine test done last Thursday, she'd like the results.  [phone]

## 2019-04-16 NOTE — Telephone Encounter (Signed)
Pt calling to get results of urine test done last week

## 2019-04-17 ENCOUNTER — Telehealth: Payer: Self-pay | Admitting: *Deleted

## 2019-04-17 NOTE — Telephone Encounter (Signed)
Patient informed per Dr Despina Hidden with a negative culture does not appear to be a UTI process but if it continues she can make an appointment to be seen. Patient states she would like to as she is still noticing blood in her urine and having abdominal pain.

## 2019-04-17 NOTE — Telephone Encounter (Signed)
With a negative culture does not appear to be a UTI process  If it continues she can make an appointment to be seen

## 2019-04-18 ENCOUNTER — Encounter: Payer: Self-pay | Admitting: Adult Health

## 2019-04-18 ENCOUNTER — Other Ambulatory Visit: Payer: Self-pay

## 2019-04-18 ENCOUNTER — Ambulatory Visit (INDEPENDENT_AMBULATORY_CARE_PROVIDER_SITE_OTHER): Payer: Self-pay | Admitting: Adult Health

## 2019-04-18 VITALS — BP 112/60 | HR 84 | Ht 62.25 in | Wt 198.0 lb

## 2019-04-18 DIAGNOSIS — Z975 Presence of (intrauterine) contraceptive device: Secondary | ICD-10-CM | POA: Insufficient documentation

## 2019-04-18 DIAGNOSIS — R1033 Periumbilical pain: Secondary | ICD-10-CM | POA: Insufficient documentation

## 2019-04-18 DIAGNOSIS — Z113 Encounter for screening for infections with a predominantly sexual mode of transmission: Secondary | ICD-10-CM | POA: Insufficient documentation

## 2019-04-18 MED ORDER — DICYCLOMINE HCL 10 MG PO CAPS: 10.0000 mg | ORAL_CAPSULE | Freq: Three times a day (TID) | ORAL | 1 refills | Status: DC

## 2019-04-18 NOTE — Progress Notes (Signed)
Patient ID: Joanna Higgins, female   DOB: January 06, 2001, 18 y.o.   MRN: [id number] History of Present Illness: Joanna Higgins is a 18 year old white female, G0P0, single in complaining of abdominal pain for about 4 weeks, it is below her navel and radiates to the right, and is worse at night.It wakes her up, and feels stabbing.Urine culture about 2 weeks ago did not grow anything. She has sone urge incontinence at times.  PCP is Dr Lilyan Punt.   Current Medications, Allergies, Past Medical History, Past Surgical History, Family History and Social History were reviewed in Joanna Corning record.     Review of Systems: Patient denies any headaches, hearing loss, fatigue, blurred vision, shortness of breath, chest pain, problems with bowel movements,  or intercourse. No joint pain or mood swings. See HPI for positives.    Physical Exam:BP 112/60 (BP Location: Right Arm, Patient Position: Sitting, Cuff Size: Normal)   Pulse 84   Ht 5' 2.25" (1.581 m)   Wt 198 lb (89.8 kg)   LMP  (LMP Unknown)   BMI 35.92 kg/m  General:  Well developed, well nourished, no acute distress Skin:  Warm and dry Abdomen:  Soft, non tender, no hepatosplenomegaly,No CVAT. Pelvic:  External genitalia is normal in appearance, no lesions.  The vagina is normal in appearance. Urethra has no lesions or masses. The cervix is smooth, nuswab obtained.  Uterus is felt to be normal size, shape, and contour,mildly tender.  No adnexal masses or tenderness noted.Bladder is mildly tender, no masses felt. Psych:  No mood changes, alert and cooperative,seems happy Fall risk is low. PHQ 2 score 0.  Examination chaperoned by Marchelle Folks Rash LPN.    Impression and Plan:  1. Periumbilical abdominal pain - NuSwab Vaginitis Plus (VG+) - US PELVIS (TRANSABDOMINAL ONLY); Future - US PELVIS TRANSVANGINAL NON-OB (TV ONLY); in about 2 weeks and see me after  Will try bentyl to see if relaxes her any  Meds ordered this encounter   Medications  . dicyclomine (BENTYL) 10 MG capsule    Sig: Take 1 capsule (10 mg total) by mouth 4 (four) times daily -  before meals and at bedtime.    Dispense:  30 capsule    Refill:  1    Order Specific Question:   Supervising Provider    Answer:   Despina Hidden, LUTHER H [[id number]]   2. Nexplanon in place  3. Screening examination for STD (sexually transmitted disease) - NuSwab Vaginitis Plus (VG+)

## 2019-04-22 LAB — NUSWAB VAGINITIS PLUS (VG+)
Candida albicans, NAA: NEGATIVE
Candida glabrata, NAA: NEGATIVE
Chlamydia trachomatis, NAA: NEGATIVE
Neisseria gonorrhoeae, NAA: NEGATIVE
Trich vag by NAA: NEGATIVE

## 2019-05-01 ENCOUNTER — Telehealth: Payer: Self-pay | Admitting: Adult Health

## 2019-05-01 ENCOUNTER — Encounter: Payer: Self-pay | Admitting: *Deleted

## 2019-05-01 NOTE — Telephone Encounter (Signed)
Patient called stating that she would like to know the results of her STD testing she had done. Please contact pt

## 2019-05-02 ENCOUNTER — Encounter: Payer: Self-pay | Admitting: Adult Health

## 2019-05-02 ENCOUNTER — Ambulatory Visit (INDEPENDENT_AMBULATORY_CARE_PROVIDER_SITE_OTHER): Payer: Self-pay

## 2019-05-02 ENCOUNTER — Ambulatory Visit (INDEPENDENT_AMBULATORY_CARE_PROVIDER_SITE_OTHER): Payer: Self-pay | Admitting: Adult Health

## 2019-05-02 ENCOUNTER — Other Ambulatory Visit: Payer: Self-pay

## 2019-05-02 VITALS — BP 126/82 | HR 105 | Temp 98.5°F | Ht 63.0 in | Wt 198.4 lb

## 2019-05-02 DIAGNOSIS — R1033 Periumbilical pain: Secondary | ICD-10-CM

## 2019-05-02 MED ORDER — DICYCLOMINE HCL 10 MG PO CAPS: 10.0000 mg | ORAL_CAPSULE | Freq: Three times a day (TID) | ORAL | 3 refills | Status: DC

## 2019-05-02 NOTE — Progress Notes (Signed)
Patient ID: Joanna Higgins, female   DOB: 01/19/2001, 18 y.o.   MRN: [id number] History of Present Illness: Joanna Higgins is a 18 year old white female, in for [place] for periumbilical pain.  PCP is Dr Lilyan Punt.    Current Medications, Allergies, Past Medical History, Past Surgical History, Family History and Social History were reviewed in Joanna Corning record.     Review of Systems: Pain near navel, is better with bentyl     Physical Exam:BP 126/82 (BP Location: Right Arm, Patient Position: Sitting, Cuff Size: Normal)   Pulse (!) 105   Temp 98.5 F (36.9 C)   Ht 5\' 3"  (1.6 m)   Wt 198 lb 6.4 oz (90 kg)   LMP 04/11/2019   BMI 35.14 kg/m  General:  Well developed, well nourished, no acute distress Skin:  Warm and dry Psych:  No mood changes, alert and cooperative,seems happy US showed: normal uterus and ovaries, EEC 4 mm, discussed that I think her pain is GI related, continue bentyl and if continues see Dr Lorin Picket and may want GI referral.    Impression: 1. Periumbilical abdominal pain -continue bentyl Meds ordered this encounter  Medications  . dicyclomine (BENTYL) 10 MG capsule    Sig: Take 1 capsule (10 mg total) by mouth 4 (four) times daily -  before meals and at bedtime.    Dispense:  30 capsule    Refill:  3    Order Specific Question:   Supervising Provider    Answer:   Lazaro Arms [[id number]]      Plan: Follow up prn See Dr Lorin Picket if continues

## 2019-05-02 NOTE — Progress Notes (Signed)
PELVIC US TA/TV:homogeneous anteverted uterus,wnl,EEC 4 mm,normal ovaries bilat,no free fluid,ovaries appear mobile,no pain during ultrasound

## 2019-05-31 ENCOUNTER — Ambulatory Visit (INDEPENDENT_AMBULATORY_CARE_PROVIDER_SITE_OTHER): Payer: Managed Care, Other (non HMO) | Admitting: Family Medicine

## 2019-05-31 ENCOUNTER — Other Ambulatory Visit: Payer: Self-pay

## 2019-05-31 DIAGNOSIS — B353 Tinea pedis: Secondary | ICD-10-CM

## 2019-05-31 MED ORDER — FLUCONAZOLE 150 MG PO TABS: ORAL_TABLET | ORAL | 4 refills | Status: DC

## 2019-05-31 MED ORDER — KETOCONAZOLE 2 % EX CREA: 1.0000 "application " | TOPICAL_CREAM | Freq: Two times a day (BID) | CUTANEOUS | 4 refills | Status: DC

## 2019-05-31 NOTE — Progress Notes (Signed)
   Subjective:    Patient ID: Joanna Higgins, female    DOB: 2001-02-28, 18 y.o.   MRN: [id number] Video visit Rash  This is a new problem. Episode onset: one week. The affected locations include the left toes and right toes. The rash is characterized by blistering, redness and dryness (aching feeling). Treatments tried: lotion. The treatment provided no relief (lotion made area on toes burn).   Virtual Visit via Video Note  I connected with Joanna Higgins on 05/31/19 at 11:00 AM EDT by a video enabled telemedicine application and verified that I am speaking with the correct person using two identifiers.  Location: Patient: home Provider: office   I discussed the limitations of evaluation and management by telemedicine and the availability of in person appointments. The patient expressed understanding and agreed to proceed.  History of Present Illness:    Observations/Objective:   Assessment and Plan:   Follow Up Instructions:    I discussed the assessment and treatment plan with the patient. The patient was provided an opportunity to ask questions and all were answered. The patient agreed with the plan and demonstrated an understanding of the instructions.   The patient was advised to call back or seek an in-person evaluation if the symptoms worsen or if the condition fails to improve as anticipated.  I provided 12 minutes of non-face-to-face time during this encounter.   Marlowe Shores, LPN    Review of Systems  Skin: Positive for rash.   No fevers no redness no pain discomfort burns itches some    Objective:   Physical Exam  Calf normal ankle normal foot normal septum on video it is apparent that the patient has tinea pedis      Assessment & Plan:  Tinea pedis Diflucan once a week for the next 3 weeks Topical Nizoral twice daily for the next 3 weeks Let us know if not improving over the course of scale of time follow-up sooner if any problems

## 2019-06-11 ENCOUNTER — Encounter (HOSPITAL_COMMUNITY): Payer: Self-pay

## 2019-06-11 ENCOUNTER — Other Ambulatory Visit: Payer: Self-pay

## 2019-06-11 ENCOUNTER — Emergency Department (HOSPITAL_COMMUNITY)
Admission: EM | Admit: 2019-06-11 | Discharge: 2019-06-11 | Disposition: A | Discharge status: Home / Self Care | Payer: Medicaid Other | Attending: Emergency Medicine | Admitting: Emergency Medicine

## 2019-06-11 DIAGNOSIS — M5431 Sciatica, right side: Secondary | ICD-10-CM | POA: Insufficient documentation

## 2019-06-11 DIAGNOSIS — F17200 Nicotine dependence, unspecified, uncomplicated: Secondary | ICD-10-CM | POA: Insufficient documentation

## 2019-06-11 HISTORY — DX: Acute embolism and thrombosis of unspecified deep veins of unspecified lower extremity: I82.409

## 2019-06-11 HISTORY — DX: Disseminated intravascular coagulation (defibrination syndrome): D65

## 2019-06-11 MED ORDER — NAPROXEN 500 MG PO TABS: 500.0000 mg | ORAL_TABLET | Freq: Two times a day (BID) | ORAL | 0 refills | Status: DC

## 2019-06-11 MED ORDER — DEXAMETHASONE SODIUM PHOSPHATE 10 MG/ML IJ SOLN
10.0000 mg | Freq: Once | INTRAMUSCULAR | Status: AC
  Administered 2019-06-11: 10 mg via INTRAMUSCULAR
  Filled 2019-06-11: qty 1

## 2019-06-11 NOTE — ED Triage Notes (Addendum)
Pt reports she sitting on her couch approx 8 pm and right leg went numb and has remained numb. Denies any back injuries. Pt does report that she does have pain from buttocks down back of leg and then leg goes numb. 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

## 2019-06-11 NOTE — ED Notes (Signed)
See triage notes. Pt ambulatory. Nad.

## 2019-06-11 NOTE — ED Provider Notes (Signed)
[facility] EMERGENCY DEPARTMENT Provider Note   CSN: [id number] Arrival date & time: 06/11/19  1330    History   Chief Complaint Chief Complaint  Patient presents with  . Numbness    HPI Joanna Higgins is a 18 y.o. female.     HPI  The patient is a 18 year old female, she has a history of some vascular abnormalities in the skin on the right, the patient has complained that over time whenever her vascular abnormality of the skin acts up she starts to develop pain and numbness down the right leg.  Over the last 2 days she has had some pain and numbness in the right leg, the pain goes down the back of the leg, the numbness encompasses almost the entire leg and she states that when she tries to walk on it it hurts.  She denies any changes in color of the leg, she states that her feet feel cold.  This is a recurrent problem, she has this from time to time, she has never seen a back surgeon according to the patient's report.  She has not been taking any medications at home.  Past Medical History:  Diagnosis Date  . Bladder instability   . DIC (disseminated intravascular coagulation) (HCC)   . DVT (deep venous thrombosis) (HCC)    2016  . Irregular menstrual bleeding 02/10/2016  . Scoliosis   . Vascular malformation     Patient Active Problem List   Diagnosis Date Noted  . Screening examination for STD (sexually transmitted disease) 04/18/2019  . Nexplanon in place 04/18/2019  . Periumbilical abdominal pain 04/18/2019  . Nexplanon insertion 07/25/2018  . Irregular menstrual bleeding 02/10/2016  . Learning disability 08/22/2015  . ADD (attention deficit disorder) 08/22/2015  . 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 DUB (dysfunctional uterine bleeding) 12/03/2013  . Anemia 12/03/2013  . Scoliosis 04/28/2013  . Adiposity 10/19/2012  . 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 UPPER ENDOSCOPY W/ SCLEROTHERAPY       OB History    Gravida  0   Para  0   Term  0   Preterm  0   AB  0   Living  0     SAB  0   TAB  0   Ectopic  0   Multiple  0   Live Births               Home Medications    Prior to Admission medications   Medication Sig Start Date End Date Taking? Authorizing Provider  etonogestrel (NEXPLANON) 68 MG IMPL implant 1 each by Subdermal route once.   Yes [provider]  naproxen (NAPROSYN) 500 MG tablet Take 1 tablet (500 mg total) by mouth 2 (two) times daily with a meal. 06/11/19   Eber HongMiller, Keegen Heffern, MD  dicyclomine (BENTYL) 10 MG capsule Take 1 capsule (10 mg total) by mouth 4 (four) times daily -  before meals and at bedtime. Patient not taking: Reported on 06/11/2019 05/02/19 06/11/19  Adline PotterGriffin, Jennifer A, NP    Family History Family History  Problem Relation Age of Onset  . Arthritis Mother   . Arthritis Father   . Arthritis Maternal Grandmother   . Diabetes Maternal Grandfather   . Arthritis Maternal Grandfather   . Arthritis Paternal Grandfather     Social History Social History   Tobacco Use  . Smoking  status: Current Some Day Smoker    Types: E-cigarettes  . Smokeless tobacco: Never Used  Substance Use Topics  . Alcohol use: No    Alcohol/week: 0.0 standard drinks  . Drug use: No     Allergies   Estrogens and Hydrocodone   Review of Systems Review of Systems  Constitutional: Negative for fever.  Gastrointestinal: Negative for nausea and vomiting.  Musculoskeletal: Positive for back pain.  Skin: Negative for rash.  Neurological: Positive for numbness.     Physical Exam Updated Vital Signs BP 127/65   Pulse 90   Temp 98.6 F (37 C) (Oral)   Resp 18   Ht 1.549 m (5\' 1" )   Wt 88 kg   SpO2 98%   BMI 36.66 kg/m   Physical Exam Vitals signs and nursing note reviewed.  Constitutional:      General: She is not in acute distress.    Appearance: She is  well-developed.  HENT:     Head: Normocephalic and atraumatic.     Mouth/Throat:     Pharynx: No oropharyngeal exudate.  Eyes:     General: No scleral icterus.       Right eye: No discharge.        Left eye: No discharge.     Conjunctiva/sclera: Conjunctivae normal.     Pupils: Pupils are equal, round, and reactive to light.  Neck:     Musculoskeletal: Normal range of motion and neck supple.     Thyroid: No thyromegaly.     Vascular: No JVD.  Cardiovascular:     Rate and Rhythm: Normal rate and regular rhythm.     Heart sounds: Normal heart sounds. No murmur. No friction rub. No gallop.   Pulmonary:     Effort: Pulmonary effort is normal. No respiratory distress.     Breath sounds: Normal breath sounds. No wheezing or rales.  Abdominal:     General: Bowel sounds are normal. There is no distension.     Palpations: Abdomen is soft. There is no mass.     Tenderness: There is no abdominal tenderness.  Musculoskeletal: Normal range of motion.        General: Tenderness present.     Comments: Mild tenderness in the right lower back Tenderness in the right buttock Tenderness with raising the right leg.  Lymphadenopathy:     Cervical: No cervical adenopathy.  Skin:    General: Skin is warm and dry.     Findings: No erythema or rash.  Neurological:     Mental Status: She is alert.     Coordination: Coordination normal.     Comments: The patient is able to straight leg raise bilaterally, there is normal symmetrical strength, she has normal symmetrical reflexes at the patellar tendons, the sensation of the right leg has some numbness, this appears to be more in the lower leg and the thigh.  The left leg has normal sensation.  Psychiatric:        Behavior: Behavior normal.      ED Treatments / Results  Labs (all labs ordered are listed, but only abnormal results are displayed) Labs Reviewed - No data to display  EKG None  Radiology No results found.  Procedures Procedures  (including critical care time)  Medications Ordered in ED Medications  dexamethasone (DECADRON) injection 10 mg (has no administration in time range)     Initial Impression / Assessment and Plan / ED Course  I have reviewed the triage vital signs and  the nursing notes.  Pertinent labs & imaging results that were available during my care of the patient were reviewed by me and considered in my medical decision making (see chart for details).       Patient has an unremarkable exam except for some numbness but has normal strength, I have watched her ambulate and she has a slight antalgic gait secondary to the pain most of which makes me feel like she has a sciatic type flareup.  Whether this is related to the vascular skin abnormality on her back or not is unclear however she has a normal neurologic exam otherwise.  There is no history of IV drug use, cancer, urinary incontinence, retention, or fever.  We will provide her with a steroid and and at home course of anti-inflammatories and a muscle relaxer, she will need to follow-up in the outpatient setting and will be given her family doctor, Dr. Wolfgang Phoenix who can refer to orthopedics as needed.  Patient agreeable.  Final Clinical Impressions(s) / ED Diagnoses   Final diagnoses:  Sciatica of right side    ED Discharge Orders         Ordered    naproxen (NAPROSYN) 500 MG tablet  2 times daily with meals     06/11/19 1826           Noemi Chapel, MD 06/11/19 1826

## 2019-06-11 NOTE — Discharge Instructions (Signed)
Back Pain: ° ° °Your back pain should be treated with medicines such as ibuprofen or aleve and this back pain should get better over the next 2 weeks.  However if you develop severe or worsening pain, low back pain with fever, numbness, weakness or inability to walk or urinate, you should return to the ER immediately.  Please follow up with your doctor this week for a recheck if still having symptoms. °Low back pain is discomfort in the lower back that may be due to injuries to muscles and ligaments around the spine.  Occasionally, it may be caused by a a problem to a part of the spine called a disc.  The pain may last several days or a week;  However, most patients get completely well in 4 weeks. ° °Self - care:  The application of heat can help soothe the pain.  Maintaining your daily activities, including walking, is encourged, as it will help you get better faster than just staying in bed. ° °Medications are also useful to help with pain control.  A commonly prescribed medications includes acetaminophen.  This medication is generally safe, though you should not take more than 8 of the extra strength (500mg) pills a day. ° °Non steroidal anti inflammatory medications including Ibuprofen and naproxen;  These medications help both pain and swelling and are very useful in treating back pain.  They should be taken with food, as they can cause stomach upset, and more seriously, stomach bleeding.   ° °Muscle relaxants:  These medications can help with muscle tightness that is a cause of lower back pain.  Most of these medications can cause drowsiness, and it is not safe to drive or use dangerous machinery while taking them. ° °You will need to follow up with  Your primary healthcare provider in 1-2 weeks for reassessment. ° °Be aware that if you develop new symptoms, such as a fever, leg weakness, difficulty with or loss of control of your urine or bowels, abdominal pain, or more severe pain, you will need to seek  medical attention and  / or return to the Emergency department. ° °If you do not have a doctor see the list below. ° °RESOURCE GUIDE ° °Chronic Pain Problems: °Contact [facility]  [phone] °Patients need to be referred by their primary care doctor. ° °Insufficient Money for Medicine: °Contact United Way:  call "211" or [facility] 271-5999. ° °No Primary Care Doctor: °- Call [facility]  832-8000 - can help you locate a primary care doctor that  accepts your insurance, provides certain services, etc. °- Physician Referral Service- 1-800-533-3463 ° °Agencies that provide inexpensive medical care: °- [facility]  832-8035 °- [facility]  832-7272 °- [facility]  [phone] °- [facility]  832-4777 °- [facility]  373-0678 °- [facility]  272-1050 ° °Medicaid-accepting [place] Providers: °- [facility]- [address] ° [phone], Mon-Fri 9am-7pm, Sat 9am-1pm °- [facility]- [address], [address] ° [phone] °- [facility]- [address], [address] ° [phone] °- [facility]- [address] [place] [address] ° [phone] °- [facility]- [address], [address], [phone] ° Only accepts [place] Medicaid patients after they have their name  applied to their card ° °Self Pay (no insurance) in [place]: °- Sickle Cell Patients: Dr Eric Dean, [facility] °   [address], [phone] °- [facility]- [address] ° [phone] °      -     [facility]- [address] °      -     [facility]- see information above (Speak to Pam H if you do not have insurance) °      -  [facility]- [address], [phone] °      -  [facility] [place]- [address],  [phone] °      -  [facility]- [address] [place] [address],  [phone] °      -  Dr Osei-Bonsu-  [address], [place], [phone] °      -  [facility]- [address], [phone] °      -  [facility]- [address] [place] [address], [phone], also [address], [phone] °      -    [facility]- [address], [phone], 1st & 3rd Saturday   every month, 10am-1pm ° °1) Find a Doctor and Pay Out of Pocket °Although you won't have to find out who is covered by your insurance plan, it is a good idea to ask around and get recommendations. You will then need to call the office and see if the doctor you have chosen will accept you as a new patient and what types of options they offer for patients who are self-pay. Some doctors offer discounts or will set up payment plans for their patients who do not have insurance, but you will need to ask so you aren't surprised when you get to your appointment. ° °2) Contact Your Local Health Department °Not all health departments have doctors that can see patients for sick visits, but many do, so it is worth a call to see if yours does. If you don't know where your local health department is, you can check in your phone book. The CDC also has a tool to help you locate your state's health department, and many state websites also have listings of all of their local health departments. ° °3) Find a Walk-in Clinic °If your illness is not likely to be very severe or complicated, you may want to try a walk in clinic. These are popping up all over the country in pharmacies, drugstores, and shopping centers. They're usually staffed by nurse practitioners or physician assistants that have been trained to treat common illnesses and complaints. They're usually fairly quick and inexpensive. However, if you have serious medical issues or chronic medical problems, these are probably not your best option ° °STD Testing °- [place] [facility] [place], [facility], [address], , phone [phone] or 1-877-539-9860.  Monday - Friday, call for an appointment. °- [place] [facility] [place], [facility], [address], [place], phone [phone] or 1-877-539-9860.  Monday - Friday, call for an appointment. ° °Abuse/Neglect: °- [place] Child Abuse Hotline (336) 641-3795 °- [place] Child Abuse Hotline 800-378-5315 (After Hours) ° °Emergency Shelter:   [facility] (336) 271-5985 ° °Maternity Homes: °- [facility] (336) 275-9566 °- [facility] (704) 372-4663 ° °MRSA Hotline #:   832-7006 ° °[place] Resources ° °[facility] [place]  [employer] [place] [facility] °[address]                 [address]         [address]  °[place]                                               [place]                              [place] °Phone:  349-3220                                  Phone:  342-7768                   Phone:  342-8140 ° °[facility], 342-8316 °- [place] [facility] - [facility]- 1-888-581-9988 °      -     [facility] in [place], [address],                                  336-349-4454, Insurance ° °[place] Child Abuse Hotline °(336) 342-1394 or (336) 342-3537 (After Hours) ° ° °Behavioral Health Services ° °Substance Abuse Resources: °- [facility]  336-882-2125 °- [facility] 336-784-9470 °- [facility] 336-285-9073 °- [facility] 336-845-3988 °- Residential & Outpatient Substance Abuse Program  800-659-3381 ° °Psychological Services: °- [facility]  832-9600 °- [facility]  378-7881 °- [facility], [address], [place], ACCESS LINE: 1-800-853-5163 or 336-641-4981, Http://www.[email or web address]/adult.htm ° °Dental Assistance ° °If unable to pay or  uninsured, contact:  [facility] or [place] [facility] to become qualified for the adult dental clinic. ° °Patients with Medicaid: [facility] °[address], [phone] °[address], [phone] ° °If unable to pay, or uninsured, contact [facility] (271-5999) or [place] [facility] (641-3152 in [place], 842-7733 in [place]) to become qualified for the adult dental clinic ° °Other Low-Cost Community Dental Services: °- [facility]- [address], [place], [place], [address], 723-1848, Ext. [address], 2nd and 4th Thursday of the month at 6:30am.  10 clients each day by appointment, can sometimes see walk-in patients if someone does not show for an appointment. °- [facility]- [address], [place], [place], [address], 723-7904 °- [facility]- [address], [place], [place], [address], 631-2330 °- [facility]- 342-8273 °- [facility]- [phone] °- [facility]- 570-6415 ° ° ° ° ° ° °

## 2019-08-03 ENCOUNTER — Ambulatory Visit: Payer: Managed Care, Other (non HMO) | Admitting: Nurse Practitioner

## 2019-08-03 ENCOUNTER — Other Ambulatory Visit: Payer: Self-pay

## 2019-08-07 ENCOUNTER — Ambulatory Visit (INDEPENDENT_AMBULATORY_CARE_PROVIDER_SITE_OTHER): Payer: Managed Care, Other (non HMO) | Admitting: Family Medicine

## 2019-08-07 ENCOUNTER — Other Ambulatory Visit: Payer: Self-pay

## 2019-08-07 DIAGNOSIS — I889 Nonspecific lymphadenitis, unspecified: Secondary | ICD-10-CM | POA: Diagnosis not present

## 2019-08-07 MED ORDER — AMOXICILLIN-POT CLAVULANATE 875-125 MG PO TABS: 1.0000 | ORAL_TABLET | Freq: Two times a day (BID) | ORAL | 0 refills | Status: DC

## 2019-08-07 NOTE — Progress Notes (Signed)
   Subjective:  Audio plus video  Patient ID: Joanna Higgins, female    DOB: 03/23/2001, 18 y.o.   MRN: [id number]  HPIlump on front of neck came up last night.  Sore to touch. No fever. No trouble swallowing or breathing.   Coughing a lot last night. Not tried any treatments.   Virtual Visit via Video Note  I connected with Suella Grove on 08/07/19 at  3:50 PM EDT by a video enabled telemedicine application and verified that I am speaking with the correct person using two identifiers.  Location: Patient: home Provider: office   I discussed the limitations of evaluation and management by telemedicine and the availability of in person appointments. The patient expressed understanding and agreed to proceed.  History of Present Illness:    Observations/Objective:   Assessment and Plan:   Follow Up Instructions:    I discussed the assessment and treatment plan with the patient. The patient was provided an opportunity to ask questions and all were answered. The patient agreed with the plan and demonstrated an understanding of the instructions.   The patient was advised to call back or seek an in-person evaluation if the symptoms worsen or if the condition fails to improve as anticipated.  I provided 18 minutes of non-face-to-face time during this encounter.      Review of Systems No headache, no major weight loss or weight gain, no chest pain no back pain abdominal pain no change in bowel habits complete ROS otherwise negative     Objective:   Physical Exam  Virtual      Assessment & Plan:  Impression probable cervical lymphadenitis.  Antibiotics prescribed symptom care discussed warning signs discussed questions answered

## 2019-08-10 ENCOUNTER — Ambulatory Visit (INDEPENDENT_AMBULATORY_CARE_PROVIDER_SITE_OTHER): Payer: Managed Care, Other (non HMO) | Admitting: Family Medicine

## 2019-08-10 ENCOUNTER — Other Ambulatory Visit: Payer: Self-pay

## 2019-08-10 DIAGNOSIS — G44209 Tension-type headache, unspecified, not intractable: Secondary | ICD-10-CM

## 2019-08-10 DIAGNOSIS — G43809 Other migraine, not intractable, without status migrainosus: Secondary | ICD-10-CM

## 2019-08-10 DIAGNOSIS — R1013 Epigastric pain: Secondary | ICD-10-CM | POA: Diagnosis not present

## 2019-08-10 MED ORDER — FAMOTIDINE 20 MG PO TABS: 20.0000 mg | ORAL_TABLET | Freq: Every day | ORAL | 3 refills | Status: DC

## 2019-08-10 MED ORDER — ONDANSETRON 8 MG PO TBDP: 8.0000 mg | ORAL_TABLET | Freq: Three times a day (TID) | ORAL | 1 refills | Status: DC | PRN

## 2019-08-10 MED ORDER — SUMATRIPTAN SUCCINATE 50 MG PO TABS: ORAL_TABLET | ORAL | 3 refills | Status: DC

## 2019-08-10 NOTE — Progress Notes (Signed)
Subjective:    Patient ID: Joanna Higgins, female    DOB: 2001-07-17, 18 y.o.   MRN: [id number]  HPI  Patient calls to discuss migraines and abdominal pain. Patient states both have been going on for a while but she seems to be getting them every night.  She describes it starts off as a throbbing headache frontal part of her head goes across both temples and then then to the back of the neck she describes nausea with it she denies high fever chills sweats denies double vision sometimes the headache is bad enough to where it causes her to throw up this is been fairly persistent for the past week and a half off and on for the past several weeks does not wake her up at night under a lot of stress at work she works at [employer]  Patient also relates she has a knot in her neck that is been present for 2 days somewhat tender on video it was hard to appreciate but no redness with it I told her if he gets worse follow-up otherwise we will see her in the office in 2 to 3 weeks  Intermittent epigastric discomfort with some nausea but no vomiting recently she denies overuse of medications does not drink or smoke but we will be doing Pepcid to try to help her plus also follow-up office visit in 2 weeks Virtual Visit via Video Note  I connected with Joanna Higgins on 08/10/19 at  1:10 PM EDT by a video enabled telemedicine application and verified that I am speaking with the correct person using two identifiers.  Location: Patient: home Provider: office   I discussed the limitations of evaluation and management by telemedicine and the availability of in person appointments. The patient expressed understanding and agreed to proceed.  History of Present Illness:    Observations/Objective: 15 minutes was spent with patient today discussing healthcare issues which they came.  More than 50% of this visit-total duration of visit-was spent in counseling and coordination of care.  Please see diagnosis  regarding the focus of this coordination and care  .15  Assessment and Plan:   Follow Up Instructions:    I discussed the assessment and treatment plan with the patient. The patient was provided an opportunity to ask questions and all were answered. The patient agreed with the plan and demonstrated an understanding of the instructions.   The patient was advised to call back or seek an in-person evaluation if the symptoms worsen or if the condition fails to improve as anticipated.  I provided 15 minutes of non-face-to-face time during this encounter.      Review of Systems  Constitutional: Negative for activity change, fatigue and fever.  HENT: Negative for congestion and rhinorrhea.   Respiratory: Negative for cough, chest tightness and shortness of breath.   Cardiovascular: Negative for chest pain and leg swelling.  Gastrointestinal: Positive for abdominal pain and nausea.  Skin: Negative for color change.  Neurological: Positive for dizziness and headaches.  Psychiatric/Behavioral: Negative for agitation and behavioral problems.       Objective:   Physical Exam Patient had virtual visit Appears to be in no distress Atraumatic Neuro able to relate and oriented No apparent resp distress Color normal        Assessment & Plan:  Tension headaches versus migraines-may use anti-inflammatory such as ibuprofen but in addition to this I think trying a triptan to see if it might help is reasonable  The patient  will keep a headache diary and follow-up in approximately 2 weeks  Intermittent stomach discomforts certainly we can send in nausea medicine but I am also going to recommend a acid blocker we will go with famotidine  We will do a follow-up office visit in person in 2 weeks

## 2019-08-11 ENCOUNTER — Encounter: Payer: Self-pay | Admitting: Family Medicine

## 2019-08-27 ENCOUNTER — Ambulatory Visit: Payer: Managed Care, Other (non HMO) | Admitting: Family Medicine

## 2019-08-28 ENCOUNTER — Telehealth: Payer: Self-pay | Admitting: Family Medicine

## 2019-08-28 NOTE — Telephone Encounter (Signed)
Mom sent MyChart message regarding patient.  Good morning Nicki Reaper this is not related to myself however it is related to my daughter Joanna Higgins I do believe that I am on her [place] form I was wondering if there is someone that you could recommend to me for her to go and have a psychological evaluation performed I would like to see where she is testing and what we can do to help better her academically.          Joanna Higgins has Academic issues. She is by no means on the level of a 20 yr old. She was on an IEP in high school. Joanna Higgins can not hold a job, unable to count money, reads at a 3 grade level.Joanna Higgins has been her doctor since birth he should be familiar.  Please advise. Thank you

## 2019-08-29 ENCOUNTER — Telehealth: Payer: Self-pay | Admitting: Family Medicine

## 2019-08-29 NOTE — Telephone Encounter (Signed)
Hi Dr.Ross This patient has significant intellectual difficulties.  Despite having an IEP at school she never achieved well.  This is impeding her ability to get and hold a job.  Are there any local resources within your department or in [place] that can do further testing of this individual in regards to possibly rating her IQ?  Or seeing if there is significant learning disabilities.  I believe this patient eventually will need possible job training and may possibly need disability as well  Any help you could give regarding resources/consultants for further evaluation and testing would be appreciated  Thank you for your input-Daphanie Oquendo

## 2019-08-29 NOTE — Telephone Encounter (Signed)
Please let the mother know that I am checking some resources to see where it may well be feasible to get further testing If she has not heard back from [place] within 1 week please send [place] a message or call

## 2019-08-30 NOTE — Telephone Encounter (Signed)
Hi! Dr Tera Mater used to be in our office and he did intellectual testing. He moved to [facility] and still does some outside work. I would have your office give him a call. If you can't find him, call Butch Penny in my office and she should have his number. Also, [facility] does testing but not sure if they open during the pandemic

## 2019-08-30 NOTE — Telephone Encounter (Signed)
Left message on moms voicemail also sent mom a  MyChart message

## 2019-09-06 ENCOUNTER — Other Ambulatory Visit: Payer: Self-pay | Admitting: Orthopedic Surgery

## 2019-09-06 ENCOUNTER — Other Ambulatory Visit (HOSPITAL_COMMUNITY): Payer: Self-pay | Admitting: Orthopedic Surgery

## 2019-09-06 DIAGNOSIS — M545 Low back pain, unspecified: Secondary | ICD-10-CM

## 2019-09-08 ENCOUNTER — Emergency Department
Admission: EM | Admit: 2019-09-08 | Discharge: 2019-09-08 | Disposition: A | Discharge status: Home / Self Care | Payer: Managed Care, Other (non HMO) | Attending: Student | Admitting: Student

## 2019-09-08 ENCOUNTER — Other Ambulatory Visit: Payer: Self-pay

## 2019-09-08 ENCOUNTER — Encounter: Payer: Self-pay | Admitting: Emergency Medicine

## 2019-09-08 DIAGNOSIS — M79602 Pain in left arm: Secondary | ICD-10-CM | POA: Insufficient documentation

## 2019-09-08 DIAGNOSIS — Z5321 Procedure and treatment not carried out due to patient leaving prior to being seen by health care provider: Secondary | ICD-10-CM | POA: Diagnosis not present

## 2019-09-08 NOTE — ED Triage Notes (Addendum)
States pain L AC where [facility]. This was her second time to donate. Ecchymosis noted L AC. Color both arms same. Radial pulse present. Skin warm and dry and normal color.

## 2019-09-08 NOTE — ED Notes (Signed)
Per tech when getting patient for room stated changed mind and is leaving without being seen.

## 2019-09-10 NOTE — Telephone Encounter (Signed)
Mother stated she would sit down and talk with Joanna Higgins and see what she is wanting/will to do and will call us back with what they decide to do.

## 2019-09-10 NOTE — Telephone Encounter (Signed)
The mother had requested evaluation for intellectual testing to see if Joanna Higgins has intellectual disabilities potentially disability as well Please let her know according to  [facility] behavioral health Dr. Tera Mater is a option for intellectual testing Also vocational rehab but I am not certain that they are open during the pandemic  If the mother would like to have [facility] referred to Dr.John Rodenbaugh please go ahead with referral

## 2019-09-10 NOTE — Telephone Encounter (Signed)
Left message to return call 

## 2019-09-13 DIAGNOSIS — Z029 Encounter for administrative examinations, unspecified: Secondary | ICD-10-CM

## 2019-09-14 ENCOUNTER — Other Ambulatory Visit: Payer: Self-pay

## 2019-09-14 ENCOUNTER — Ambulatory Visit (HOSPITAL_COMMUNITY)
Admission: RE | Admit: 2019-09-14 | Discharge: 2019-09-14 | Disposition: A | Discharge status: Home / Self Care | Payer: Managed Care, Other (non HMO) | Source: Ambulatory Visit | Attending: Orthopedic Surgery | Admitting: Orthopedic Surgery

## 2019-09-14 DIAGNOSIS — M545 Low back pain, unspecified: Secondary | ICD-10-CM

## 2019-09-20 ENCOUNTER — Other Ambulatory Visit: Payer: Self-pay | Admitting: Family Medicine

## 2019-09-20 NOTE — Telephone Encounter (Signed)
Tried to contact patient to see if/why she needs this refilled. Phone has calling restrictions.

## 2019-10-01 ENCOUNTER — Other Ambulatory Visit: Payer: Self-pay | Admitting: Physical Medicine and Rehabilitation

## 2019-10-01 DIAGNOSIS — G8929 Other chronic pain: Secondary | ICD-10-CM

## 2019-10-01 DIAGNOSIS — M545 Low back pain, unspecified: Secondary | ICD-10-CM

## 2019-10-03 ENCOUNTER — Other Ambulatory Visit: Payer: Self-pay

## 2019-10-03 ENCOUNTER — Ambulatory Visit
Admission: RE | Admit: 2019-10-03 | Discharge: 2019-10-03 | Disposition: A | Discharge status: Home / Self Care | Payer: Managed Care, Other (non HMO) | Source: Ambulatory Visit | Attending: Physical Medicine and Rehabilitation | Admitting: Physical Medicine and Rehabilitation

## 2019-10-03 DIAGNOSIS — G8929 Other chronic pain: Secondary | ICD-10-CM

## 2019-10-03 DIAGNOSIS — M545 Low back pain, unspecified: Secondary | ICD-10-CM

## 2019-10-03 MED ORDER — METHYLPREDNISOLONE ACETATE 40 MG/ML INJ SUSP (RADIOLOG
120.0000 mg | Freq: Once | INTRAMUSCULAR | Status: AC
  Administered 2019-10-03: 12:00:00 120 mg via EPIDURAL

## 2019-10-03 MED ORDER — IOPAMIDOL (ISOVUE-M 200) INJECTION 41%
1.0000 mL | Freq: Once | INTRAMUSCULAR | Status: AC
  Administered 2019-10-03: 12:00:00 1 mL via EPIDURAL

## 2019-10-03 NOTE — Discharge Instructions (Signed)

## 2019-10-17 ENCOUNTER — Ambulatory Visit: Payer: Managed Care, Other (non HMO) | Admitting: Adult Health

## 2019-10-23 ENCOUNTER — Telehealth: Payer: Self-pay | Admitting: Family Medicine

## 2019-10-23 NOTE — Telephone Encounter (Signed)
I recommend office visit to examine the neck this could be done later this week

## 2019-10-23 NOTE — Telephone Encounter (Signed)
Patient was seen 8/11 for lump in neck and patient states its back and wanting something called into [facility]

## 2019-10-24 NOTE — Telephone Encounter (Signed)
Tried to call, phone has calling restrictions, unable to leave a message

## 2019-11-19 ENCOUNTER — Ambulatory Visit (INDEPENDENT_AMBULATORY_CARE_PROVIDER_SITE_OTHER): Payer: Managed Care, Other (non HMO) | Admitting: Family Medicine

## 2019-11-19 ENCOUNTER — Other Ambulatory Visit: Payer: Self-pay

## 2019-11-19 DIAGNOSIS — R1084 Generalized abdominal pain: Secondary | ICD-10-CM

## 2019-11-19 MED ORDER — ONDANSETRON 8 MG PO TBDP: 8.0000 mg | ORAL_TABLET | Freq: Three times a day (TID) | ORAL | 1 refills | Status: DC | PRN

## 2019-11-19 NOTE — Progress Notes (Signed)
   Subjective:    Patient ID: Joanna Higgins, female    DOB: 2001/04/20, 18 y.o.   MRN: [id number]  HPI  Patient calls with abdominal pain, headache and vomiting for 3 days. Patient states she having hard time keeping things down- no fever. Patient with intermittent abdominal pain some intermittent headache nausea intermittent throwing up past 3 days no diarrhea denies fever denies bloody stools denies diarrhea PMH benign Virtual Visit via Video Note  I connected with Joanna Higgins on 11/19/19 at  3:00 PM EST by a video enabled telemedicine application and verified that I am speaking with the correct person using two identifiers.  Location: Patient: home Provider: office   I discussed the limitations of evaluation and management by telemedicine and the availability of in person appointments. The patient expressed understanding and agreed to proceed.  History of Present Illness:    Observations/Objective:   Assessment and Plan:   Follow Up Instructions:    I discussed the assessment and treatment plan with the patient. The patient was provided an opportunity to ask questions and all were answered. The patient agreed with the plan and demonstrated an understanding of the instructions.   The patient was advised to call back or seek an in-person evaluation if the symptoms worsen or if the condition fails to improve as anticipated.  I provided 15 minutes of non-face-to-face time during this encounter.     Review of Systems  Constitutional: Negative for activity change, appetite change and fatigue.  HENT: Negative for congestion.   Respiratory: Negative for cough.   Cardiovascular: Negative for chest pain.  Gastrointestinal: Negative for abdominal pain.  Skin: Negative for color change.  Neurological: Negative for headaches.  Psychiatric/Behavioral: Negative for behavioral problems.       Objective:   Physical Exam  Patient had virtual visit Appears to be in no  distress Atraumatic Neuro able to relate and oriented No apparent resp distress Color normal       Assessment & Plan:  Could be a virus could be gastritis recommend office visit tomorrow for further evaluation I doubt Covid go to ER if worse overnight  Addendum-patient was seen in the office and she describes it mainly as upper epigastric pain and discomfort.  Her urine was unremarkable.  There was some concern that she has some underlying issues possibly a gastritis.  Therefore we will use pantoprazole as well as Carafate and if the patient is not improving over the next 2 to 3 weeks possible need for referral and blood work for H. pylori possible EGD but currently right now not necessary patient was instructed to let us know if not improved in the next 2 to 3 weeks sooner if any problems

## 2019-11-20 MED ORDER — SUCRALFATE 1 G PO TABS: 1.0000 g | ORAL_TABLET | Freq: Three times a day (TID) | ORAL | 0 refills | Status: DC

## 2019-11-20 MED ORDER — PANTOPRAZOLE SODIUM 40 MG PO TBEC: 40.0000 mg | DELAYED_RELEASE_TABLET | Freq: Every day | ORAL | 3 refills | Status: DC

## 2019-12-11 ENCOUNTER — Ambulatory Visit (INDEPENDENT_AMBULATORY_CARE_PROVIDER_SITE_OTHER): Payer: Managed Care, Other (non HMO) | Admitting: Family Medicine

## 2019-12-11 ENCOUNTER — Other Ambulatory Visit: Payer: Self-pay

## 2019-12-11 ENCOUNTER — Encounter: Payer: Managed Care, Other (non HMO) | Admitting: Neurology

## 2019-12-11 DIAGNOSIS — R1013 Epigastric pain: Secondary | ICD-10-CM

## 2019-12-11 MED ORDER — DICYCLOMINE HCL 10 MG PO CAPS: ORAL_CAPSULE | ORAL | 3 refills | Status: DC

## 2019-12-11 MED ORDER — SUCRALFATE 1 G PO TABS: 1.0000 g | ORAL_TABLET | Freq: Three times a day (TID) | ORAL | 0 refills | Status: DC

## 2019-12-11 MED ORDER — PANTOPRAZOLE SODIUM 40 MG PO TBEC: 40.0000 mg | DELAYED_RELEASE_TABLET | Freq: Every day | ORAL | 3 refills | Status: DC

## 2019-12-11 NOTE — Progress Notes (Signed)
   Subjective:    Patient ID: Joanna Higgins, female    DOB: Sep 05, 2001, 18 y.o.   MRN: [id number]  HPI Patient relates mainly epigastric pain and discomfort she states the medicine was helping but then she ran out of the medicine she did not think she had any refills.  She denies any high fever chills sweats denies vomiting no blood in her bowel movements denies any wheezing or difficulty breathing PMH has had some reoccurring abdominal pain over the past several months lab work looked reassuring earlier this year no weight loss  Patient calls for a follow up on abdominal pain. Patient states she is not vomiting or nauseated anymore but she is still having cramps.  Virtual Visit via Video Note  I connected with Joanna Higgins on 12/11/19 at 11:00 AM EST by a video enabled telemedicine application and verified that I am speaking with the correct person using two identifiers.  Location: Patient: home Provider: office   I discussed the limitations of evaluation and management by telemedicine and the availability of in person appointments. The patient expressed understanding and agreed to proceed.  History of Present Illness:    Observations/Objective:   Assessment and Plan:   Follow Up Instructions:    I discussed the assessment and treatment plan with the patient. The patient was provided an opportunity to ask questions and all were answered. The patient agreed with the plan and demonstrated an understanding of the instructions.   The patient was advised to call back or seek an in-person evaluation if the symptoms worsen or if the condition fails to improve as anticipated.  I provided 16 minutes of non-face-to-face time during this encounter.    Review of Systems  Constitutional: Negative for activity change and appetite change.  HENT: Negative for congestion and rhinorrhea.   Respiratory: Negative for cough and shortness of breath.   Cardiovascular: Negative for chest pain  and leg swelling.  Gastrointestinal: Positive for abdominal pain. Negative for blood in stool, constipation, nausea and vomiting.  Skin: Negative for color change.  Neurological: Negative for dizziness and weakness.  Psychiatric/Behavioral: Negative for agitation and confusion.       Objective:   Physical Exam  Patient had virtual visit Appears to be in no distress Atraumatic Neuro able to relate and oriented No apparent resp distress Color normal       Assessment & Plan:  Abdominal pain Supportive measures Go ahead with PPI as well as Carafate also dicyclomine.  In addition to this minimize caffeine's chocolates tomato based products Also stay away from NSAIDs If not dramatically better over the next 2 to 3 weeks to let us know we will help set up with gastroenterology for the possibility of getting an EGD.  No other interventions necessary currently

## 2020-01-22 DIAGNOSIS — M545 Low back pain: Secondary | ICD-10-CM | POA: Diagnosis not present

## 2020-01-29 ENCOUNTER — Encounter: Payer: Self-pay | Admitting: Advanced Practice Midwife

## 2020-01-29 ENCOUNTER — Telehealth: Payer: Self-pay | Admitting: *Deleted

## 2020-01-29 ENCOUNTER — Ambulatory Visit (INDEPENDENT_AMBULATORY_CARE_PROVIDER_SITE_OTHER): Payer: Medicaid Other | Admitting: Advanced Practice Midwife

## 2020-01-29 ENCOUNTER — Other Ambulatory Visit: Payer: Self-pay

## 2020-01-29 VITALS — BP 111/74 | HR 108 | Ht 63.0 in | Wt 196.8 lb

## 2020-01-29 DIAGNOSIS — Z975 Presence of (intrauterine) contraceptive device: Secondary | ICD-10-CM | POA: Diagnosis not present

## 2020-01-29 DIAGNOSIS — N921 Excessive and frequent menstruation with irregular cycle: Secondary | ICD-10-CM

## 2020-01-29 NOTE — Progress Notes (Signed)
   GYN VISIT Patient name: Joanna Higgins MRN [id number]  Date of birth: 2001-12-18 Chief Complaint:   Bleeding when gets in shower (Cramps and Migraines)  History of Present Illness:   Joanna Higgins is a 19 y.o. G0P0000 Caucasian female being seen today for noticing a spot of blood on the shower floor. She is assuming this was vaginal but isn't sure. Denies constipation/hemorrhoids. She states she had a Nexplanon placed two months ago (per charting, it was placed July 2019). States she hasn't had any vag bleeding since it was placed.  Patient's last menstrual period was 01/16/2020 (exact date). The current method of family planning is Nexplanon.  Last pap <21yo. Is not currently sexually active due to her boyfriend having an inguinal hernia.  Review of Systems:   Pertinent items are noted in HPI Denies fever/chills, dizziness, headaches, visual disturbances, fatigue, shortness of breath, chest pain, abdominal pain, vomiting, abnormal vaginal discharge/itching/odor/irritation, problems with periods, bowel movements, urination, or intercourse unless otherwise stated above.  Pertinent History Reviewed:  Reviewed past medical,surgical, social, obstetrical and family history.  Reviewed problem list, medications and allergies. Physical Assessment:   Vitals:   01/29/20 1441  BP: 111/74  Pulse: (!) 108  Weight: 196 lb 12.8 oz (89.3 kg)  Height: 5\' 3"  (1.6 m)  Body mass index is 34.86 kg/m.       Physical Examination:   General appearance: alert, well appearing, and in no distress  Mental status: alert, oriented to person, place, and time  Skin: warm & dry   Cardiovascular: normal heart rate noted  Respiratory: normal respiratory effort, no distress  Abdomen: soft, non-tender   Pelvic:  examination not indicated; pt declined as the vag bldg was a spot last week  Extremities: no edema   Chaperone: n/a    No results found for this or any previous visit (from the past 24 hour(s)).    Assessment & Plan:  1) Breakthrough spotting on Nexplanon> just an occasional spot or two, pt reports it's only at night; I reassured her that this is normal; she will watch it for now  Meds: No orders of the defined types were placed in this encounter.   No orders of the defined types were placed in this encounter.   Return for April for Pap/physical.  May [facility] 01/29/2020 3:02 PM

## 2020-01-29 NOTE — Telephone Encounter (Signed)
Pt states that when she takes a shower something comes out.  She left a second message stating that it looks like blood.

## 2020-01-29 NOTE — Telephone Encounter (Signed)
Pt was seen in office today. Encounter closed. JSY 

## 2020-02-01 ENCOUNTER — Ambulatory Visit
Admission: EM | Admit: 2020-02-01 | Discharge: 2020-02-01 | Disposition: A | Discharge status: Home / Self Care | Payer: Medicaid Other | Attending: Emergency Medicine | Admitting: Emergency Medicine

## 2020-02-01 ENCOUNTER — Other Ambulatory Visit: Payer: Self-pay

## 2020-02-01 DIAGNOSIS — N939 Abnormal uterine and vaginal bleeding, unspecified: Secondary | ICD-10-CM

## 2020-02-01 DIAGNOSIS — R1011 Right upper quadrant pain: Secondary | ICD-10-CM | POA: Diagnosis not present

## 2020-02-01 DIAGNOSIS — Z0189 Encounter for other specified special examinations: Secondary | ICD-10-CM

## 2020-02-01 LAB — POCT URINALYSIS DIP (MANUAL ENTRY)
Bilirubin, UA: NEGATIVE
Glucose, UA: NEGATIVE mg/dL
Ketones, POC UA: NEGATIVE mg/dL
Leukocytes, UA: NEGATIVE
Nitrite, UA: NEGATIVE
Protein Ur, POC: NEGATIVE mg/dL
Spec Grav, UA: >=1.03 — AB (ref 1.010–1.025)
Urobilinogen, UA: 0.2 E.U./dL
pH, UA: 6 (ref 5.0–8.0)

## 2020-02-01 LAB — POCT URINE PREGNANCY: Preg Test, Ur: NEGATIVE

## 2020-02-01 MED ORDER — ONDANSETRON HCL 4 MG PO TABS: 4.0000 mg | ORAL_TABLET | Freq: Four times a day (QID) | ORAL | 0 refills | Status: DC

## 2020-02-01 MED ORDER — DICYCLOMINE HCL 20 MG PO TABS: 20.0000 mg | ORAL_TABLET | Freq: Two times a day (BID) | ORAL | 0 refills | Status: DC

## 2020-02-01 NOTE — Discharge Instructions (Signed)
Urine did not show signs of infection Urine pregnancy negative Discussed further evaluation and management in the ED to rule out obstructed gallstone.  Patient states she will go to the ED tomorrow if symptoms worsen or do not improve.  Aware of risk associated with this decision including missed diagnosis, organ damage, organ failure and/or death Zofran prescribed.  Take as needed for nausea Bentyl prescribed. Use as needed for abdominal discomfort Otherwise follow up with PCP for GI referral.   Will follow up with GYN regarding break-through bleeding Go to the ED if you have fever, chills, nausea, vomiting, blood in stool, black stools, urinary symptoms including burning, frequency urgency, lightheadedness, dizziness, chest pain, shortness of breath, etc..Marland Kitchen

## 2020-02-01 NOTE — ED Provider Notes (Signed)
[facility]   [id number] 02/01/20 Arrival Time: 1911  CC: ABDOMINAL DISCOMFORT  SUBJECTIVE:  Joanna Higgins is a 19 y.o. female who presents with complaint of RUQ pain x 1-2 month.  Denies a precipitating event, or trauma.  Denies changes in diet, close contacts with similar symptoms, changes in medication.  Localizes pain to RUQ.  Describes as constant, stable, and stabbing in character.  Has been seen by PCP for this and was prescribed carafate, PPI, and bentyl without relief.  PCP instructed patient to follow up if symptoms persisted.  Has not followed up with PCP. Worse with eating, especially with eating greasy or fatty foods.  Complains of associated nausea and an episode of vomiting.  Also reports intermittent vaginal bleeding x 1 month.  Blood clot in shower today.  Had office visit with GYN on 01/29/20 and reassured patient this was normal break-through bleeding with nexplanon.  Instructed patient to follow up as needed.  Complains of associated lightheadedness.    Would like urine checked for kidney infection.    Denies fever, chills, dizziness, weight changes, vomiting, chest pain, SOB, diarrhea, constipation, hematochezia, melena, dysuria, difficulty urinating, increased frequency or urgency, flank pain, loss of bowel or bladder function, vaginal discharge, vaginal odor, dyspareunia, pelvic pain.     Patient's last menstrual period was 01/05/2020. Nexplanon placed 2 months ago.    ROS: As per HPI.  All other pertinent ROS negative.     Past Medical History:  Diagnosis Date  . Bladder instability   . DIC (disseminated intravascular coagulation) (HCC)   . DVT (deep venous thrombosis) (HCC)    2016  . Irregular menstrual bleeding 02/10/2016  . Scoliosis   . Vascular malformation    Past Surgical History:  Procedure Laterality Date  . UPPER ENDOSCOPY W/ SCLEROTHERAPY     Allergies  Allergen Reactions  . Estrogens Other (See Comments)    Specialist recommends  avoidance of estrogen due to history of DIC  . Hydrocodone Nausea And Vomiting   No current facility-administered medications on file prior to encounter.   Current Outpatient Medications on File Prior to Encounter  Medication Sig Dispense Refill  . etonogestrel (NEXPLANON) 68 MG IMPL implant 1 each by Subdermal route once.    . traMADol (ULTRAM) 50 MG tablet Take 50 mg by mouth every 8 (eight) hours as needed.     Social History   Socioeconomic History  . Marital status: Single    Spouse name: Not on file  . Number of children: Not on file  . Years of education: Not on file  . Highest education level: Not on file  Occupational History  . Not on file  Tobacco Use  . Smoking status: Current Some Day Smoker    Types: E-cigarettes  . Smokeless tobacco: Never Used  Substance and Sexual Activity  . Alcohol use: No    Alcohol/week: 0.0 standard drinks  . Drug use: No  . Sexual activity: Yes    Birth control/protection: Implant  Other Topics Concern  . Not on file  Social History Narrative  . Not on file   Social Determinants of Health   Financial Resource Strain:   . Difficulty of Paying Living Expenses: Not on file  Food Insecurity:   . Worried About Programme researcher, broadcasting/film/video in the Last Year: Not on file  . Ran Out of Food in the Last Year: Not on file  Transportation Needs:   . Lack of Transportation (Medical): Not on file  .  Lack of Transportation (Non-Medical): Not on file  Physical Activity:   . Days of Exercise per Week: Not on file  . Minutes of Exercise per Session: Not on file  Stress:   . Feeling of Stress : Not on file  Social Connections:   . Frequency of Communication with Friends and Family: Not on file  . Frequency of Social Gatherings with Friends and Family: Not on file  . Attends Religious Services: Not on file  . Active Member of Clubs or Organizations: Not on file  . Attends Archivist Meetings: Not on file  . Marital Status: Not on file    Intimate Partner Violence:   . Fear of Current or Ex-Partner: Not on file  . Emotionally Abused: Not on file  . Physically Abused: Not on file  . Sexually Abused: Not on file   Family History  Problem Relation Age of Onset  . Arthritis Mother   . Arthritis Father   . Arthritis Maternal Grandmother   . Diabetes Maternal Grandfather   . Arthritis Maternal Grandfather   . Arthritis Paternal Grandfather      OBJECTIVE:  Vitals:   02/01/20 1924  BP: 116/60  Pulse: (!) 115  Resp: 16  Temp: 98.8 F (37.1 C)  TempSrc: Oral  SpO2: 97%    HR: 92 bpm  General appearance: Alert; NAD HEENT: NCAT.  Oropharynx clear.  Lungs: clear to auscultation bilaterally without adventitious breath sounds Heart: regular rate and rhythm.  Radial pulses 2+ symmetrical bilaterally Abdomen: soft, non-distended; normal active bowel sounds; TTP over RUQ; nontender at McBurney's point; + Murphy's sign; minimal guarding Back: no CVA tenderness Extremities: no edema; symmetrical with no gross deformities Skin: warm and dry Neurologic: normal gait Psychological: alert and cooperative; anxious mood and affect  LABS: Results for orders placed or performed during the hospital encounter of 02/01/20 (from the past 24 hour(s))  POCT urinalysis dipstick     Status: Abnormal   Collection Time: 02/01/20  7:35 PM  Result Value Ref Range   Color, UA yellow yellow   Clarity, UA clear clear   Glucose, UA negative negative mg/dL   Bilirubin, UA negative negative   Ketones, POC UA negative negative mg/dL   Spec Grav, UA >=1.030 (A) 1.010 - 1.025   Blood, UA moderate (A) negative   pH, UA 6.0 5.0 - 8.0   Protein Ur, POC negative negative mg/dL   Urobilinogen, UA 0.2 0.2 or 1.0 E.U./dL   Nitrite, UA Negative Negative   Leukocytes, UA Negative Negative  POCT urine pregnancy     Status: None   Collection Time: 02/01/20  7:35 PM  Result Value Ref Range   Preg Test, Ur Negative Negative   ASSESSMENT &  PLAN:  1. RUQ pain   2. Vaginal bleeding   3. Visit for laboratory test     Meds ordered this encounter  Medications  . ondansetron (ZOFRAN) 4 MG tablet    Sig: Take 1 tablet (4 mg total) by mouth every 6 (six) hours.    Dispense:  12 tablet    Refill:  0    Order Specific Question:   Supervising Provider    Answer:   Raylene Everts [[id number]]  . dicyclomine (BENTYL) 20 MG tablet    Sig: Take 1 tablet (20 mg total) by mouth 2 (two) times daily.    Dispense:  20 tablet    Refill:  0    Order Specific Question:   Supervising Provider  AnswerEustace Moore [[id number]]   Urine did not show signs of infection Urine pregnancy negative Discussed further evaluation and management in the ED to rule out obstructed gallstone.  Patient states she will go to the ED tomorrow if symptoms worsen or do not improve.  Aware of risk associated with this decision including missed diagnosis, organ damage, organ failure and/or death Zofran prescribed.  Take as needed for nausea Bentyl prescribed. Use as needed for abdominal discomfort Otherwise follow up with PCP for GI referral.   Will follow up with GYN regarding break-through bleeding Go to the ED if you have fever, chills, nausea, vomiting, blood in stool, black stools, urinary symptoms including burning, frequency urgency, lightheadedness, dizziness, chest pain, shortness of breath, etc...   Rennis Harding, PA-C 02/01/20 1951

## 2020-02-01 NOTE — ED Triage Notes (Signed)
Pt presents to UC w/ c/o right upper abd pain x1 month. Pt c/o pain and nausea after eating. Pt also c/o vaginal bleeding x1 month "off and on." Pt is concerned for gallbladder or kidney dysfunction.

## 2020-02-02 ENCOUNTER — Emergency Department (HOSPITAL_COMMUNITY)
Admission: EM | Admit: 2020-02-02 | Discharge: 2020-02-02 | Disposition: A | Discharge status: Home / Self Care | Payer: Medicaid Other | Attending: Emergency Medicine | Admitting: Emergency Medicine

## 2020-02-02 ENCOUNTER — Encounter (HOSPITAL_COMMUNITY): Payer: Self-pay | Admitting: Emergency Medicine

## 2020-02-02 ENCOUNTER — Other Ambulatory Visit: Payer: Self-pay

## 2020-02-02 ENCOUNTER — Emergency Department (HOSPITAL_COMMUNITY): Payer: Medicaid Other

## 2020-02-02 DIAGNOSIS — F17210 Nicotine dependence, cigarettes, uncomplicated: Secondary | ICD-10-CM | POA: Insufficient documentation

## 2020-02-02 DIAGNOSIS — R1011 Right upper quadrant pain: Secondary | ICD-10-CM | POA: Diagnosis present

## 2020-02-02 DIAGNOSIS — K8020 Calculus of gallbladder without cholecystitis without obstruction: Secondary | ICD-10-CM | POA: Insufficient documentation

## 2020-02-02 DIAGNOSIS — K7689 Other specified diseases of liver: Secondary | ICD-10-CM | POA: Diagnosis not present

## 2020-02-02 DIAGNOSIS — R9431 Abnormal electrocardiogram [ECG] [EKG]: Secondary | ICD-10-CM | POA: Diagnosis not present

## 2020-02-02 HISTORY — DX: Other intervertebral disc displacement, lumbar region: M51.26

## 2020-02-02 HISTORY — DX: Other intervertebral disc degeneration, lumbar region: M51.36

## 2020-02-02 HISTORY — DX: Other intervertebral disc degeneration, lumbar region without mention of lumbar back pain or lower extremity pain: M51.369

## 2020-02-02 LAB — CBC WITH DIFFERENTIAL/PLATELET
Abs Immature Granulocytes: 0.05 10*3/uL (ref 0.00–0.07)
Basophils Absolute: 0.1 10*3/uL (ref 0.0–0.1)
Basophils Relative: 1 %
Eosinophils Absolute: 0.5 10*3/uL (ref 0.0–0.5)
Eosinophils Relative: 4 %
HCT: 44.7 % (ref 36.0–46.0)
Hemoglobin: 14.7 g/dL (ref 12.0–15.0)
Immature Granulocytes: 1 %
Lymphocytes Relative: 28 %
Lymphs Abs: 3 10*3/uL (ref 0.7–4.0)
MCH: 31.1 pg (ref 26.0–34.0)
MCHC: 32.9 g/dL (ref 30.0–36.0)
MCV: 94.7 fL (ref 80.0–100.0)
Monocytes Absolute: 0.8 10*3/uL (ref 0.1–1.0)
Monocytes Relative: 7 %
Neutro Abs: 6.2 10*3/uL (ref 1.7–7.7)
Neutrophils Relative %: 59 %
Platelets: 284 10*3/uL (ref 150–400)
RBC: 4.72 MIL/uL (ref 3.87–5.11)
RDW: 12.8 % (ref 11.5–15.5)
WBC: 10.5 10*3/uL (ref 4.0–10.5)
nRBC: 0 % (ref 0.0–0.2)

## 2020-02-02 LAB — URINALYSIS, ROUTINE W REFLEX MICROSCOPIC
Bilirubin Urine: NEGATIVE
Glucose, UA: NEGATIVE mg/dL
Ketones, ur: NEGATIVE mg/dL
Leukocytes,Ua: NEGATIVE
Nitrite: NEGATIVE
Protein, ur: NEGATIVE mg/dL
RBC / HPF: >50 RBC/hpf — ABNORMAL HIGH (ref 0–5)
Specific Gravity, Urine: 1.009 (ref 1.005–1.030)
pH: 7 (ref 5.0–8.0)

## 2020-02-02 LAB — COMPREHENSIVE METABOLIC PANEL
ALT: 22 U/L (ref 0–44)
AST: 17 U/L (ref 15–41)
Albumin: 4.5 g/dL (ref 3.5–5.0)
Alkaline Phosphatase: 62 U/L (ref 38–126)
Anion gap: 9 (ref 5–15)
BUN: 15 mg/dL (ref 6–20)
CO2: 26 mmol/L (ref 22–32)
Calcium: 9.6 mg/dL (ref 8.9–10.3)
Chloride: 103 mmol/L (ref 98–111)
Creatinine, Ser: 0.72 mg/dL (ref 0.44–1.00)
GFR calc Af Amer: >60 mL/min (ref >60)
GFR calc non Af Amer: >60 mL/min (ref >60)
Glucose, Bld: 89 mg/dL (ref 70–99)
Potassium: 4.2 mmol/L (ref 3.5–5.1)
Sodium: 138 mmol/L (ref 135–145)
Total Bilirubin: 0.5 mg/dL (ref 0.3–1.2)
Total Protein: 8 g/dL (ref 6.5–8.1)

## 2020-02-02 LAB — LIPASE, BLOOD: Lipase: 22 U/L (ref 11–51)

## 2020-02-02 MED ORDER — ONDANSETRON 4 MG PO TBDP: 4.0000 mg | ORAL_TABLET | Freq: Three times a day (TID) | ORAL | 0 refills | Status: DC | PRN

## 2020-02-02 MED ORDER — OXYCODONE-ACETAMINOPHEN 5-325 MG PO TABS: 1.0000 | ORAL_TABLET | Freq: Four times a day (QID) | ORAL | 0 refills | Status: DC | PRN

## 2020-02-02 MED ORDER — ONDANSETRON HCL 4 MG/2ML IJ SOLN
4.0000 mg | Freq: Once | INTRAMUSCULAR | Status: AC
  Administered 2020-02-02: 4 mg via INTRAVENOUS
  Filled 2020-02-02: qty 2

## 2020-02-02 MED ORDER — MORPHINE SULFATE (PF) 2 MG/ML IV SOLN
2.0000 mg | Freq: Once | INTRAVENOUS | Status: AC
  Administered 2020-02-02: 2 mg via INTRAVENOUS
  Filled 2020-02-02: qty 1

## 2020-02-02 NOTE — ED Provider Notes (Signed)
[facility] EMERGENCY DEPARTMENT Provider Note   CSN: [id number] Arrival date & time: 02/02/20  1006     History Chief Complaint  Patient presents with  . Abdominal Pain    Joanna Higgins is a 19 y.o. female with a history as outlined below presenting with a 1-2 month history of intermittent RUQ pain associated with nausea with emesis x 1 yesterday and triggered by eating, especially high fat foods. Pain is sharp and is without radiation.   She was seen at our local urgent care yesterday and was referred here for further evaluation. She last ate taco's last night which made her pain worse. She denies fevers or chills, has no diarrhea, last normal bm earlier today.  She has found no alleviators for her symptoms.   HPI     Past Medical History:  Diagnosis Date  . Bladder instability   . Bulging lumbar disc   . DIC (disseminated intravascular coagulation) (HCC)   . DVT (deep venous thrombosis) (HCC)    2016  . Irregular menstrual bleeding 02/10/2016  . Scoliosis   . Vascular malformation     Patient Active Problem List   Diagnosis Date Noted  . Screening examination for STD (sexually transmitted disease) 04/18/2019  . Nexplanon in place 04/18/2019  . Periumbilical abdominal pain 04/18/2019  . Nexplanon insertion 07/25/2018  . Irregular menstrual bleeding 02/10/2016  . Learning disability 08/22/2015  . ADD (attention deficit disorder) 08/22/2015  . 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 DUB (dysfunctional uterine bleeding) 12/03/2013  . Anemia 12/03/2013  . Scoliosis 04/28/2013  . Adiposity 10/19/2012  . 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 UPPER ENDOSCOPY W/ SCLEROTHERAPY       OB History    Gravida  0   Para  0   Term  0   Preterm  0   AB  0   Living  0     SAB  0   TAB  0   Ectopic  0   Multiple  0   Live Births               Family History  Problem Relation Age of Onset  . Arthritis Mother   . Arthritis Father   . Arthritis Maternal Grandmother   . Diabetes Maternal Grandfather   . Arthritis Maternal Grandfather   . Arthritis Paternal Grandfather     Social History   Tobacco Use  . Smoking status: Current Every Day Smoker    Packs/day: 1.00    Types: E-cigarettes, Cigarettes  . Smokeless tobacco: Never Used  Substance Use Topics  . Alcohol use: No    Alcohol/week: 0.0 standard drinks  . Drug use: No    Home Medications Prior to Admission medications   Medication Sig Start Date End Date Taking? Authorizing Provider  etonogestrel (NEXPLANON) 68 MG IMPL implant 1 each by Subdermal route once.   Yes [provider]  traMADol (ULTRAM) 50 MG tablet Take 50 mg by mouth every 8 (eight) hours as needed. 10/08/19  Yes [provider]  dicyclomine (BENTYL) 20 MG tablet Take 1 tablet (20 mg total) by mouth 2 (two) times daily. Patient not taking: Reported on 02/02/2020 02/01/20   Alvino Chapel, [place], PA-C  ondansetron (ZOFRAN ODT) 4 MG disintegrating tablet Take 1 tablet (4 mg total) by mouth every 8 (eight) hours as needed for nausea or vomiting. 02/02/20  Burgess Amor, PA-C  ondansetron (ZOFRAN) 4 MG tablet Take 1 tablet (4 mg total) by mouth every 6 (six) hours. Patient not taking: Reported on 02/02/2020 02/01/20   Rennis Harding, PA-C  oxyCODONE-acetaminophen (PERCOCET/ROXICET) 5-325 MG tablet Take 1 tablet by mouth every 6 (six) hours as needed for severe pain. 02/02/20   Burgess Amor, PA-C    Allergies    Estrogens and Hydrocodone  Review of Systems   Review of Systems  Constitutional: Negative for chills and fever.  HENT: Negative for congestion and sore throat.   Eyes: Negative.   Respiratory: Negative for chest tightness and shortness of breath.   Cardiovascular: Negative for chest pain.  Gastrointestinal: Positive for abdominal pain, nausea and vomiting. Negative for  constipation and diarrhea.  Genitourinary: Negative.   Musculoskeletal: Negative for arthralgias, joint swelling and neck pain.  Skin: Negative.  Negative for rash and wound.  Neurological: Negative for dizziness, weakness, light-headedness, numbness and headaches.  Psychiatric/Behavioral: Negative.     Physical Exam Updated Vital Signs BP 103/68 (BP Location: Right Arm)   Pulse 93   Temp 98.9 F (37.2 C) (Oral)   Resp 18   Ht 5\' 3"  (1.6 m)   Wt 89.3 kg   LMP 01/16/2020 (Exact Date)   SpO2 98%   BMI 34.87 kg/m   Physical Exam Vitals and nursing note reviewed.  Constitutional:      Appearance: She is well-developed.  HENT:     Head: Normocephalic and atraumatic.  Eyes:     Conjunctiva/sclera: Conjunctivae normal.  Cardiovascular:     Rate and Rhythm: Normal rate and regular rhythm.     Heart sounds: Normal heart sounds.  Pulmonary:     Effort: Pulmonary effort is normal.     Breath sounds: Normal breath sounds. No wheezing.  Abdominal:     General: Bowel sounds are normal.     Palpations: Abdomen is soft.     Tenderness: There is abdominal tenderness in the right upper quadrant. There is no guarding or rebound. Positive signs include Murphy's sign.  Musculoskeletal:        General: Normal range of motion.     Cervical back: Normal range of motion.  Skin:    General: Skin is warm and dry.  Neurological:     Mental Status: She is alert.     ED Results / Procedures / Treatments   Labs (all labs ordered are listed, but only abnormal results are displayed) Labs Reviewed  URINALYSIS, ROUTINE W REFLEX MICROSCOPIC - Abnormal; Notable for the following components:      Result Value   APPearance HAZY (*)    Hgb urine dipstick LARGE (*)    RBC / HPF >50 (*)    Bacteria, UA RARE (*)    All other components within normal limits  CBC WITH DIFFERENTIAL/PLATELET  COMPREHENSIVE METABOLIC PANEL  LIPASE, BLOOD  POC URINE PREG, ED    EKG EKG  Interpretation  Date/Time:  Saturday February 02 2020 11:49:34 EST Ventricular Rate:  93 PR Interval:    QRS Duration: 82 QT Interval:  346 QTC Calculation: 431 R Axis:   43 Text Interpretation: Sinus rhythm Low voltage, precordial leads Borderline T wave abnormalities Confirmed by 04-30-1989 [phone]) on 02/02/2020 12:17:00 PM   Radiology 04/01/2020 Abdomen Limited RUQ  Result Date: 02/02/2020 CLINICAL DATA:  Right upper quadrant abdominal pain for 1 month. EXAM: ULTRASOUND ABDOMEN LIMITED RIGHT UPPER QUADRANT COMPARISON:  September 14, 2018. FINDINGS: Gallbladder: Cholelithiasis is noted without gallbladder wall  thickening or pericholecystic fluid. Positive sonographic Murphy's sign is noted. Common bile duct: Diameter: 4 mm which is within normal limits. Liver: No focal lesion identified. Increased echogenicity of hepatic parenchyma is noted suggesting hepatic steatosis or other diffuse hepatocellular disease. Portal vein is patent on color Doppler imaging with normal direction of blood flow towards the liver. Other: None. IMPRESSION: Cholelithiasis without gallbladder wall thickening or pericholecystic fluid, but positive sonographic Murphy's sign is noted. If there is clinical concern for cholecystitis, HIDA scan may be performed further evaluation. Increased echogenicity of hepatic parenchyma is noted suggesting hepatic steatosis or other diffuse hepatocellular disease. Electronically Signed   By: Marijo Conception M.D.   On: 02/02/2020 14:16    Procedures Procedures (including critical care time)  Medications Ordered in ED Medications  ondansetron (ZOFRAN) injection 4 mg (4 mg Intravenous Given 02/02/20 1142)  morphine 2 MG/ML injection 2 mg (2 mg Intravenous Given 02/02/20 1142)    ED Course  I have reviewed the triage vital signs and the nursing notes.  Pertinent labs & imaging results that were available during my care of the patient were reviewed by me and considered in my medical  decision making (see chart for details).    MDM Rules/Calculators/A&P                      Discussed with Dr. Arnoldo Morale who will see pt in close outpatient f/u. Pt understands to call for appt on Monday.  Discussed results of labs and [place] with pt and her mother on the phone and all questions were answered. Labs normal, pt afebrile.  No evidence for acute cholecystitis.  Strict return precautions outlined.    Of note, ekg not ordered by me.  Pt has no c/o chest pain or sob.  Final Clinical Impression(s) / ED Diagnoses Final diagnoses:  RUQ abdominal pain  Calculus of gallbladder without cholecystitis without obstruction    Rx / DC Orders ED Discharge Orders         Ordered    ondansetron (ZOFRAN ODT) 4 MG disintegrating tablet  Every 8 hours PRN     02/02/20 1546    oxyCODONE-acetaminophen (PERCOCET/ROXICET) 5-325 MG tablet  Every 6 hours PRN     02/02/20 1546           Evalee Jefferson, PA-C 02/02/20 1611    Fredia Sorrow, MD 02/03/20 [phone]

## 2020-02-02 NOTE — Discharge Instructions (Addendum)
Your lab tests are reassuring today, but as discussed you have gallstones which I suspect is the source of your symptoms.  There is no indication today that you need emergent surgery, but you will need to have your gallbladder removed to resolve your symptoms.  Call Dr. Lovell Sheehan on Monday as planned.  You may use the oxycodone and zofran if needed for pain and nausea relief.  Avoid eating any high fat foods - this is the food component that causes the gallbladder to squeeze.    You may take the oxycodone prescribed for pain relief.  This will make you drowsy - do not drive within 4 hours of taking this medication.  Return here immediately if you have worsening pain, uncontrolled vomiting or you develop any fevers.

## 2020-02-02 NOTE — ED Triage Notes (Signed)
Pt seen at [facility] last night she was told that she has gall stones and her upper abd is hurting

## 2020-02-04 LAB — POC URINE PREG, ED: Preg Test, Ur: NEGATIVE

## 2020-02-05 ENCOUNTER — Ambulatory Visit: Payer: Medicaid Other | Attending: Internal Medicine

## 2020-02-05 ENCOUNTER — Ambulatory Visit: Payer: Medicaid Other | Admitting: Family Medicine

## 2020-02-05 ENCOUNTER — Encounter: Payer: Managed Care, Other (non HMO) | Admitting: Neurology

## 2020-02-05 ENCOUNTER — Ambulatory Visit
Admission: EM | Admit: 2020-02-05 | Discharge: 2020-02-05 | Disposition: A | Discharge status: Home / Self Care | Payer: Medicaid Other | Attending: Emergency Medicine | Admitting: Emergency Medicine

## 2020-02-05 ENCOUNTER — Other Ambulatory Visit: Payer: Self-pay

## 2020-02-05 DIAGNOSIS — R1011 Right upper quadrant pain: Secondary | ICD-10-CM | POA: Diagnosis not present

## 2020-02-05 DIAGNOSIS — Z20822 Contact with and (suspected) exposure to covid-19: Secondary | ICD-10-CM | POA: Diagnosis not present

## 2020-02-05 DIAGNOSIS — K8020 Calculus of gallbladder without cholecystitis without obstruction: Secondary | ICD-10-CM

## 2020-02-05 MED ORDER — ACETAMINOPHEN 325 MG PO TABS: 650.0000 mg | ORAL_TABLET | Freq: Four times a day (QID) | ORAL | 0 refills | Status: DC | PRN

## 2020-02-05 NOTE — ED Provider Notes (Addendum)
[facility]    CSN: [id number] Arrival date & time: 02/05/20  1414      History   Chief Complaint Chief Complaint  Patient presents with  . Abdominal Pain    HPI Joanna Higgins is a 19 y.o. female.   Who presented to the urgent care with a complaint of worsening abdominal pain.  Reported history of intermittent right upper quadrant for the past 2 to 3 months.  Symptom is associated with nausea and emesis.  Patient went to the ED on 02/02/2020 and was diagnosed with gallstone.  Reports she is using Percocet 5-325mg  with no symptom relief.  Denies any precipitating event. Localizes her pain to the right upper quadrant.  Reports she has an appointment coming up with Dr. Lovell Sheehan to schedule a surgery for removal.  Currently denies chills, fever, nausea, vomiting, diarrhea, chest pain, and chest tightness.  The history is provided by the patient. No language interpreter was used.    Past Medical History:  Diagnosis Date  . Bladder instability   . Bulging lumbar disc   . DIC (disseminated intravascular coagulation) (HCC)   . DVT (deep venous thrombosis) (HCC)    2016  . Irregular menstrual bleeding 02/10/2016  . Scoliosis   . Vascular malformation     Patient Active Problem List   Diagnosis Date Noted  . Screening examination for STD (sexually transmitted disease) 04/18/2019  . Nexplanon in place 04/18/2019  . Periumbilical abdominal pain 04/18/2019  . Nexplanon insertion 07/25/2018  . Irregular menstrual bleeding 02/10/2016  . Learning disability 08/22/2015  . ADD (attention deficit disorder) 08/22/2015  . 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 DUB (dysfunctional uterine bleeding) 12/03/2013  . Anemia 12/03/2013  . Scoliosis 04/28/2013  . Adiposity 10/19/2012  . 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 UPPER ENDOSCOPY W/  SCLEROTHERAPY      OB History    Gravida  0   Para  0   Term  0   Preterm  0   AB  0   Living  0     SAB  0   TAB  0   Ectopic  0   Multiple  0   Live Births               Home Medications    Prior to Admission medications   Medication Sig Start Date End Date Taking? Authorizing Provider  acetaminophen (TYLENOL) 325 MG tablet Take 2 tablets (650 mg total) by mouth every 6 (six) hours as needed. 02/05/20   Caresse Sedivy, Zachery Dakins, FNP  dicyclomine (BENTYL) 20 MG tablet Take 1 tablet (20 mg total) by mouth 2 (two) times daily. Patient not taking: Reported on 02/02/2020 02/01/20   Wurst, [place], PA-C  etonogestrel (NEXPLANON) 68 MG IMPL implant 1 each by Subdermal route once.    [provider]  ondansetron (ZOFRAN ODT) 4 MG disintegrating tablet Take 1 tablet (4 mg total) by mouth every 8 (eight) hours as needed for nausea or vomiting. 02/02/20   Burgess Amor, PA-C  ondansetron (ZOFRAN) 4 MG tablet Take 1 tablet (4 mg total) by mouth every 6 (six) hours. Patient not taking: Reported on 02/02/2020 02/01/20   Rennis Harding, PA-C  oxyCODONE-acetaminophen (PERCOCET/ROXICET) 5-325 MG tablet Take 1 tablet by mouth every 6 (six) hours as needed for severe pain. 02/02/20   Burgess Amor, PA-C  traMADol (ULTRAM) 50 MG tablet  Take 50 mg by mouth every 8 (eight) hours as needed. 10/08/19   [provider]    Family History Family History  Problem Relation Age of Onset  . Arthritis Mother   . Arthritis Father   . Arthritis Maternal Grandmother   . Diabetes Maternal Grandfather   . Arthritis Maternal Grandfather   . Arthritis Paternal Grandfather     Social History Social History   Tobacco Use  . Smoking status: Current Every Day Smoker    Packs/day: 1.00    Types: E-cigarettes, Cigarettes  . Smokeless tobacco: Never Used  Substance Use Topics  . Alcohol use: No    Alcohol/week: 0.0 standard drinks  . Drug use: No     Allergies   Estrogens and  Hydrocodone   Review of Systems Review of Systems  Constitutional: Negative.   Respiratory: Negative.   Cardiovascular: Negative.   Gastrointestinal: Positive for abdominal pain.  All other systems reviewed and are negative.    Physical Exam Triage Vital Signs ED Triage Vitals  Enc Vitals Group     BP 02/05/20 1430 (!) 106/56     Pulse Rate 02/05/20 1430 (!) 102     Resp 02/05/20 1430 18     Temp 02/05/20 1430 98 F (36.7 C)     Temp src --      SpO2 02/05/20 1430 95 %     Weight --      Height --      Head Circumference --      Peak Flow --      Pain Score 02/05/20 1428 9     Pain Loc --      Pain Edu? --      Excl. in [place]? --    No data found.  Updated Vital Signs BP (!) 106/56   Pulse (!) 102   Temp 98 F (36.7 C)   Resp 18   LMP 01/16/2020 (Exact Date)   SpO2 95%   Visual Acuity Right Eye Distance:   Left Eye Distance:   Bilateral Distance:    Right Eye Near:   Left Eye Near:    Bilateral Near:     Physical Exam Vitals and nursing note reviewed.  Constitutional:      General: She is not in acute distress.    Appearance: Normal appearance. She is normal weight. She is not ill-appearing or toxic-appearing.  Cardiovascular:     Rate and Rhythm: Normal rate and regular rhythm.     Pulses: Normal pulses.     Heart sounds: Normal heart sounds. No murmur. No friction rub. No gallop.   Pulmonary:     Effort: Pulmonary effort is normal. No respiratory distress.     Breath sounds: Normal breath sounds. No stridor. No wheezing, rhonchi or rales.  Chest:     Chest wall: No tenderness.  Abdominal:     General: Abdomen is flat. Bowel sounds are normal. There is no distension.     Palpations: Abdomen is soft. There is no mass.     Tenderness: There is abdominal tenderness in the right upper quadrant.     Hernia: No hernia is present.  Neurological:     Mental Status: She is alert.      UC Treatments / Results  Labs (all labs ordered are listed, but  only abnormal results are displayed) Labs Reviewed - No data to display  EKG   Radiology No results found.  Procedures Procedures (including critical care time)  Medications  Ordered in UC Medications - No data to display  Initial Impression / Assessment and Plan / UC Course  I have reviewed the triage vital signs and the nursing notes.  Pertinent labs & imaging results that were available during my care of the patient were reviewed by me and considered in my medical decision making (see chart for details).     RUQ pain and calculus of gallbladder Patient stable for discharge. Advised patient to alternate Percocet and Tylenol for pain management. Not to exceed 4000 mg of acetaminophen daily Keep in follow-up with Dr. Lovell Sheehan in 2 days To go to ED for worsening of symptoms  Final Clinical Impressions(s) / UC Diagnoses   Final diagnoses:  Abdominal pain, right upper quadrant  Calculus of gallbladder without cholecystitis without obstruction     Discharge Instructions     Advised patient to take Zofran for nausea Advised patient to take Percocet as prescribed every 6 hours May add Tylenol 325 to 650 mg every 6 hours as needed To go to ED for worsening of symptoms    ED Prescriptions    Medication Sig Dispense Auth. Provider   acetaminophen (TYLENOL) 325 MG tablet Take 2 tablets (650 mg total) by mouth every 6 (six) hours as needed. 30 tablet Gerhard Rappaport, Zachery Dakins, FNP     PDMP not reviewed this encounter.   Durward Parcel, FNP 02/05/20 1525    Durward Parcel, FNP 02/05/20 1529

## 2020-02-05 NOTE — ED Triage Notes (Signed)
Pt presents with complaints of right upper abdominal pain x 4 weeks. Patient was seen here and the ER previously and was diagnosed with gallstones. She was told she needed to have her gallbladder removed but does not have the surgery scheduled yet. Pt states that the medication they gave her to take at home is not helping.

## 2020-02-05 NOTE — Discharge Instructions (Addendum)
Advised patient to take Zofran for nausea Advised patient to take Percocet as prescribed every 6 hours May add Tylenol 325 to 650 mg every 6 hours as needed not to exceed 4000 mg To go to ED for worsening of symptoms

## 2020-02-06 ENCOUNTER — Telehealth: Payer: Self-pay | Admitting: Family Medicine

## 2020-02-06 LAB — NOVEL CORONAVIRUS, NAA: SARS-CoV-2, NAA: NOT DETECTED

## 2020-02-06 NOTE — Telephone Encounter (Signed)
Covid test negative

## 2020-02-06 NOTE — Telephone Encounter (Signed)
Pt got notification she has test results available on mychart and would like to know the results of her COVID test

## 2020-02-06 NOTE — Telephone Encounter (Signed)
Pt.notified

## 2020-02-07 ENCOUNTER — Ambulatory Visit (INDEPENDENT_AMBULATORY_CARE_PROVIDER_SITE_OTHER): Payer: Medicaid Other | Admitting: General Surgery

## 2020-02-07 ENCOUNTER — Other Ambulatory Visit: Payer: Self-pay

## 2020-02-07 ENCOUNTER — Encounter: Payer: Self-pay | Admitting: General Surgery

## 2020-02-07 VITALS — BP 103/64 | HR 99 | Temp 98.7°F | Resp 14 | Ht 62.0 in | Wt 199.2 lb

## 2020-02-07 DIAGNOSIS — K8020 Calculus of gallbladder without cholecystitis without obstruction: Secondary | ICD-10-CM | POA: Diagnosis not present

## 2020-02-07 NOTE — Progress Notes (Signed)
Joanna Higgins; [id number]; 05/10/2001   HPI Patient is a 19 year old white female who was referred to my care by Lilyan Punt in the emergency room for evaluation treatment of cholelithiasis.  The patient has been seen in the emergency room twice for right upper quadrant abdominal pain, nausea, and fatty food intolerance.  Ultrasound the gallbladder revealed cholelithiasis with a positive Murphy sign.  Her liver tests were within normal limits.  She states this has been occurring over the past few weeks and does not getting better.  She has pain and nausea intermittently on a daily basis.  She is very anxious and would like her gallbladder removed.  She currently has 0 out of 10 pain.  The pain is brought on by eating and radiates from her right upper quadrant to her right flank.  She denies any fever, chills, or jaundice. Past Medical History:  Diagnosis Date  . Bladder instability   . Bulging lumbar disc   . DIC (disseminated intravascular coagulation) (HCC)   . DVT (deep venous thrombosis) (HCC)    2016  . Irregular menstrual bleeding 02/10/2016  . Scoliosis   . Vascular malformation     Past Surgical History:  Procedure Laterality Date  . UPPER ENDOSCOPY W/ SCLEROTHERAPY      Family History  Problem Relation Age of Onset  . Arthritis Mother   . Arthritis Father   . Arthritis Maternal Grandmother   . Diabetes Maternal Grandfather   . Arthritis Maternal Grandfather   . Arthritis Paternal Grandfather     Current Outpatient Medications on File Prior to Visit  Medication Sig Dispense Refill  . acetaminophen (TYLENOL) 325 MG tablet Take 2 tablets (650 mg total) by mouth every 6 (six) hours as needed. 30 tablet 0  . dicyclomine (BENTYL) 20 MG tablet Take 1 tablet (20 mg total) by mouth 2 (two) times daily. 20 tablet 0  . etonogestrel (NEXPLANON) 68 MG IMPL implant 1 each by Subdermal route once.    . ondansetron (ZOFRAN) 4 MG tablet Take 1 tablet (4 mg total) by mouth every 6 (six)  hours. 12 tablet 0  . oxyCODONE-acetaminophen (PERCOCET/ROXICET) 5-325 MG tablet Take 1 tablet by mouth every 6 (six) hours as needed for severe pain. 20 tablet 0   No current facility-administered medications on file prior to visit.    Allergies  Allergen Reactions  . 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 Hydrocodone Nausea And Vomiting    Social History   Substance and Sexual Activity  Alcohol Use No  . Alcohol/week: 0.0 standard drinks    Social History   Tobacco Use  Smoking Status Current Every Day Smoker  . Packs/day: 1.00  . Types: E-cigarettes, Cigarettes  Smokeless Tobacco Never Used    Review of Systems  Constitutional: Negative.   HENT: Negative.   Eyes: Negative.   Respiratory: Negative.   Cardiovascular: Negative.   Gastrointestinal: Positive for abdominal pain and nausea.  Genitourinary: Negative.   Musculoskeletal: Positive for back pain.  Skin: Negative.   Neurological: Negative.   Endo/Heme/Allergies: Negative.   Psychiatric/Behavioral: Negative.     Objective   Vitals:   02/07/20 1117  BP: 103/64  Pulse: 99  Resp: 14  Temp: 98.7 F (37.1 C)  SpO2: 97%    Physical Exam Vitals reviewed.  Constitutional:      Appearance: Normal appearance. She is not ill-appearing.  Eyes:     General: No scleral  icterus. Cardiovascular:     Rate and Rhythm: Normal rate and regular rhythm.     Heart sounds: Normal heart sounds. No murmur. No friction rub. No gallop.   Pulmonary:     Effort: Pulmonary effort is normal. No respiratory distress.     Breath sounds: Normal breath sounds. No stridor. No wheezing, rhonchi or rales.  Abdominal:     General: There is no distension.     Palpations: Abdomen is soft. There is no mass.     Tenderness: There is no abdominal tenderness. There is guarding. There is no rebound.     Hernia: No hernia is present.     Comments: Some guarding over the  right upper quadrant to palpation.  No rigidity is noted.  Skin:    General: Skin is warm and dry.  Neurological:     Mental Status: She is alert and oriented to person, place, and time.   ER notes reviewed  Assessment  Biliary colic, cholelithiasis Plan   Patient is scheduled for laparoscopic cholecystectomy on 02/13/2020.  Risks and benefits of the procedure including bleeding, infection, hepatobiliary injury, the possibility of an open procedure were fully explained to the patient, who gave informed consent.

## 2020-02-07 NOTE — H&P (Signed)
Joanna Higgins; [id number]; 05/10/2001   HPI Patient is a 19 year old white female who was referred to my care by Lilyan Punt in the emergency room for evaluation treatment of cholelithiasis.  The patient has been seen in the emergency room twice for right upper quadrant abdominal pain, nausea, and fatty food intolerance.  Ultrasound the gallbladder revealed cholelithiasis with a positive Murphy sign.  Her liver tests were within normal limits.  She states this has been occurring over the past few weeks and does not getting better.  She has pain and nausea intermittently on a daily basis.  She is very anxious and would like her gallbladder removed.  She currently has 0 out of 10 pain.  The pain is brought on by eating and radiates from her right upper quadrant to her right flank.  She denies any fever, chills, or jaundice. Past Medical History:  Diagnosis Date  . Bladder instability   . Bulging lumbar disc   . DIC (disseminated intravascular coagulation) (HCC)   . DVT (deep venous thrombosis) (HCC)    2016  . Irregular menstrual bleeding 02/10/2016  . Scoliosis   . Vascular malformation     Past Surgical History:  Procedure Laterality Date  . UPPER ENDOSCOPY W/ SCLEROTHERAPY      Family History  Problem Relation Age of Onset  . Arthritis Mother   . Arthritis Father   . Arthritis Maternal Grandmother   . Diabetes Maternal Grandfather   . Arthritis Maternal Grandfather   . Arthritis Paternal Grandfather     Current Outpatient Medications on File Prior to Visit  Medication Sig Dispense Refill  . acetaminophen (TYLENOL) 325 MG tablet Take 2 tablets (650 mg total) by mouth every 6 (six) hours as needed. 30 tablet 0  . dicyclomine (BENTYL) 20 MG tablet Take 1 tablet (20 mg total) by mouth 2 (two) times daily. 20 tablet 0  . etonogestrel (NEXPLANON) 68 MG IMPL implant 1 each by Subdermal route once.    . ondansetron (ZOFRAN) 4 MG tablet Take 1 tablet (4 mg total) by mouth every 6 (six)  hours. 12 tablet 0  . oxyCODONE-acetaminophen (PERCOCET/ROXICET) 5-325 MG tablet Take 1 tablet by mouth every 6 (six) hours as needed for severe pain. 20 tablet 0   No current facility-administered medications on file prior to visit.    Allergies  Allergen Reactions  . 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 Hydrocodone Nausea And Vomiting    Social History   Substance and Sexual Activity  Alcohol Use No  . Alcohol/week: 0.0 standard drinks    Social History   Tobacco Use  Smoking Status Current Every Day Smoker  . Packs/day: 1.00  . Types: E-cigarettes, Cigarettes  Smokeless Tobacco Never Used    Review of Systems  Constitutional: Negative.   HENT: Negative.   Eyes: Negative.   Respiratory: Negative.   Cardiovascular: Negative.   Gastrointestinal: Positive for abdominal pain and nausea.  Genitourinary: Negative.   Musculoskeletal: Positive for back pain.  Skin: Negative.   Neurological: Negative.   Endo/Heme/Allergies: Negative.   Psychiatric/Behavioral: Negative.     Objective   Vitals:   02/07/20 1117  BP: 103/64  Pulse: 99  Resp: 14  Temp: 98.7 F (37.1 C)  SpO2: 97%    Physical Exam Vitals reviewed.  Constitutional:      Appearance: Normal appearance. She is not ill-appearing.  Eyes:     General: No scleral  icterus. Cardiovascular:     Rate and Rhythm: Normal rate and regular rhythm.     Heart sounds: Normal heart sounds. No murmur. No friction rub. No gallop.   Pulmonary:     Effort: Pulmonary effort is normal. No respiratory distress.     Breath sounds: Normal breath sounds. No stridor. No wheezing, rhonchi or rales.  Abdominal:     General: There is no distension.     Palpations: Abdomen is soft. There is no mass.     Tenderness: There is no abdominal tenderness. There is guarding. There is no rebound.     Hernia: No hernia is present.     Comments: Some guarding over the  right upper quadrant to palpation.  No rigidity is noted.  Skin:    General: Skin is warm and dry.  Neurological:     Mental Status: She is alert and oriented to person, place, and time.   ER notes reviewed  Assessment  Biliary colic, cholelithiasis Plan   Patient is scheduled for laparoscopic cholecystectomy on 02/13/2020.  Risks and benefits of the procedure including bleeding, infection, hepatobiliary injury, the possibility of an open procedure were fully explained to the patient, who gave informed consent. 

## 2020-02-07 NOTE — Patient Instructions (Signed)
Laparoscopic Cholecystectomy Laparoscopic cholecystectomy is surgery to remove the gallbladder. The gallbladder is a pear-shaped organ that lies beneath the liver on the right side of the body. The gallbladder stores bile, which is a fluid that helps the body to digest fats. Cholecystectomy is often done for inflammation of the gallbladder (cholecystitis). This condition is usually caused by a buildup of gallstones (cholelithiasis) in the gallbladder. Gallstones can block the flow of bile, which can result in inflammation and pain. In severe cases, emergency surgery may be required. This procedure is done though small incisions in your abdomen (laparoscopic surgery). A thin scope with a camera (laparoscope) is inserted through one incision. Thin surgical instruments are inserted through the other incisions. In some cases, a laparoscopic procedure may be turned into a type of surgery that is done through a larger incision (open surgery). Tell a health care provider about:  Any allergies you have.  All medicines you are taking, including vitamins, herbs, eye drops, creams, and over-the-counter medicines.  Any problems you or family members have had with anesthetic medicines.  Any blood disorders you have.  Any surgeries you have had.  Any medical conditions you have.  Whether you are pregnant or may be pregnant. What are the risks? Generally, this is a safe procedure. However, problems may occur, including:  Infection.  Bleeding.  Allergic reactions to medicines.  Damage to other structures or organs.  A stone remaining in the common bile duct. The common bile duct carries bile from the gallbladder into the small intestine.  A bile leak from the cyst duct that is clipped when your gallbladder is removed. What happens before the procedure?   Medicines  Ask your health care provider about: ? Changing or stopping your regular medicines. This is especially important if you are taking  diabetes medicines or blood thinners. ? Taking medicines such as aspirin and ibuprofen. These medicines can thin your blood. Do not take these medicines before your procedure if your health care provider instructs you not to.  You may be given antibiotic medicine to help prevent infection. General instructions  Let your health care provider know if you develop a cold or an infection before surgery.  Plan to have someone take you home from the hospital or clinic.  Ask your health care provider how your surgical site will be marked or identified. What happens during the procedure?   To reduce your risk of infection: ? Your health care team will wash or sanitize their hands. ? Your skin will be washed with soap. ? Hair may be removed from the surgical area.  An IV tube may be inserted into one of your veins.  You will be given one or more of the following: ? A medicine to help you relax (sedative). ? A medicine to make you fall asleep (general anesthetic).  A breathing tube will be placed in your mouth.  Your surgeon will make several small cuts (incisions) in your abdomen.  The laparoscope will be inserted through one of the small incisions. The camera on the laparoscope will send images to a TV screen (monitor) in the operating room. This lets your surgeon see inside your abdomen.  Air-like gas will be pumped into your abdomen. This will expand your abdomen to give the surgeon more room to perform the surgery.  Other tools that are needed for the procedure will be inserted through the other incisions. The gallbladder will be removed through one of the incisions.  Your common bile duct   may be examined. If stones are found in the common bile duct, they may be removed.  After your gallbladder has been removed, the incisions will be closed with stitches (sutures), staples, or skin glue.  Your incisions may be covered with a bandage (dressing). The procedure may vary among health  care providers and hospitals. What happens after the procedure?  Your blood pressure, heart rate, breathing rate, and blood oxygen level will be monitored until the medicines you were given have worn off.  You will be given medicines as needed to control your pain.  Do not drive for 24 hours if you were given a sedative. This information is not intended to replace advice given to you by your health care provider. Make sure you discuss any questions you have with your health care provider. Document Revised: 11/25/2017 Document Reviewed: 05/31/2016 Elsevier Patient Education  2020 [employer]  

## 2020-02-08 NOTE — Patient Instructions (Signed)
Your procedure is scheduled on: 02/13/2020  Report to [facility] at    7:30 AM.  Call this number if you have problems the morning of surgery: [phone]   Remember:   Do not Eat or Drink after midnight         No Smoking the morning of surgery  :  Take these medicines the morning of surgery with A SIP OF WATER: none   Do not wear jewelry, make-up or nail polish.  Do not wear lotions, powders, or perfumes. You may wear deodorant.  Do not shave 48 hours prior to surgery. Men may shave face and neck.  Do not bring valuables to the hospital.  Contacts, dentures or bridgework may not be worn into surgery.  Leave suitcase in the car. After surgery it may be brought to your room.  For patients admitted to the hospital, checkout time is 11:00 AM the day of discharge.   Patients discharged the day of surgery will not be allowed to drive home.    Special Instructions: Shower using CHG night before surgery and shower the day of surgery use CHG.  Use special wash - you have one bottle of CHG for all showers.  You should use approximately 1/2 of the bottle for each shower.  Laparoscopic Cholecystectomy, Care After This sheet gives you information about how to care for yourself after your procedure. Your health care provider may also give you more specific instructions. If you have problems or questions, contact your health care provider. What can I expect after the procedure? After the procedure, it is common to have:  Pain at your incision sites. You will be given medicines to control this pain.  Mild nausea or vomiting.  Bloating and possible shoulder pain from the air-like gas that was used during the procedure. Follow these instructions at home: Incision care   Follow instructions from your health care provider about how to take care of your incisions. Make sure you: ? Wash your hands with soap and water before you change your bandage (dressing). If soap and water are not  available, use hand sanitizer. ? Change your dressing as told by your health care provider. ? Leave stitches (sutures), skin glue, or adhesive strips in place. These skin closures may need to be in place for 2 weeks or longer. If adhesive strip edges start to loosen and curl up, you may trim the loose edges. Do not remove adhesive strips completely unless your health care provider tells you to do that.  Do not take baths, swim, or use a hot tub until your health care provider approves. Ask your health care provider if you can take showers. You may only be allowed to take sponge baths for bathing.  Check your incision area every day for signs of infection. Check for: ? More redness, swelling, or pain. ? More fluid or blood. ? Warmth. ? Pus or a bad smell. Activity  Do not drive or use heavy machinery while taking prescription pain medicine.  Do not lift anything that is heavier than 10 lb (4.5 kg) until your health care provider approves.  Do not play contact sports until your health care provider approves.  Do not drive for 24 hours if you were given a medicine to help you relax (sedative).  Rest as needed. Do not return to work or school until your health care provider approves. General instructions  Take over-the-counter and prescription medicines only as told by your health care provider.  To  prevent or treat constipation while you are taking prescription pain medicine, your health care provider may recommend that you: ? Drink enough fluid to keep your urine clear or pale yellow. ? Take over-the-counter or prescription medicines. ? Eat foods that are high in fiber, such as fresh fruits and vegetables, whole grains, and beans. ? Limit foods that are high in fat and processed sugars, such as fried and sweet foods. Contact a health care provider if:  You develop a rash.  You have more redness, swelling, or pain around your incisions.  You have more fluid or blood coming from your  incisions.  Your incisions feel warm to the touch.  You have pus or a bad smell coming from your incisions.  You have a fever.  One or more of your incisions breaks open. Get help right away if:  You have trouble breathing.  You have chest pain.  You have increasing pain in your shoulders.  You faint or feel dizzy when you stand.  You have severe pain in your abdomen.  You have nausea or vomiting that lasts for more than one day.  You have leg pain. This information is not intended to replace advice given to you by your health care provider. Make sure you discuss any questions you have with your health care provider. Document Revised: 11/25/2017 Document Reviewed: 05/31/2016 Elsevier Patient Education  2020 [place] Anesthesia, Adult, Care After This sheet gives you information about how to care for yourself after your procedure. Your health care provider may also give you more specific instructions. If you have problems or questions, contact your health care provider. What can I expect after the procedure? After the procedure, the following side effects are common:  Pain or discomfort at the IV site.  Nausea.  Vomiting.  Sore throat.  Trouble concentrating.  Feeling cold or chills.  Weak or tired.  Sleepiness and fatigue.  Soreness and body aches. These side effects can affect parts of the body that were not involved in surgery. Follow these instructions at home:  For at least 24 hours after the procedure:  Have a responsible adult stay with you. It is important to have someone help care for you until you are awake and alert.  Rest as needed.  Do not: ? Participate in activities in which you could fall or become injured. ? Drive. ? Use heavy machinery. ? Drink alcohol. ? Take sleeping pills or medicines that cause drowsiness. ? Make important decisions or sign legal documents. ? Take care of children on your own. Eating and  drinking  Follow any instructions from your health care provider about eating or drinking restrictions.  When you feel hungry, start by eating small amounts of foods that are soft and easy to digest (bland), such as toast. Gradually return to your regular diet.  Drink enough fluid to keep your urine pale yellow.  If you vomit, rehydrate by drinking water, juice, or clear broth. General instructions  If you have sleep apnea, surgery and certain medicines can increase your risk for breathing problems. Follow instructions from your health care provider about wearing your sleep device: ? Anytime you are sleeping, including during daytime naps. ? While taking prescription pain medicines, sleeping medicines, or medicines that make you drowsy.  Return to your normal activities as told by your health care provider. Ask your health care provider what activities are safe for you.  Take over-the-counter and prescription medicines only as told by your health care  provider.  If you smoke, do not smoke without supervision.  Keep all follow-up visits as told by your health care provider. This is important. Contact a health care provider if:  You have nausea or vomiting that does not get better with medicine.  You cannot eat or drink without vomiting.  You have pain that does not get better with medicine.  You are unable to pass urine.  You develop a skin rash.  You have a fever.  You have redness around your IV site that gets worse. Get help right away if:  You have difficulty breathing.  You have chest pain.  You have blood in your urine or stool, or you vomit blood. Summary  After the procedure, it is common to have a sore throat or nausea. It is also common to feel tired.  Have a responsible adult stay with you for the first 24 hours after general anesthesia. It is important to have someone help care for you until you are awake and alert.  When you feel hungry, start by eating  small amounts of foods that are soft and easy to digest (bland), such as toast. Gradually return to your regular diet.  Drink enough fluid to keep your urine pale yellow.  Return to your normal activities as told by your health care provider. Ask your health care provider what activities are safe for you. This information is not intended to replace advice given to you by your health care provider. Make sure you discuss any questions you have with your health care provider. Document Revised: 12/16/2017 Document Reviewed: 07/29/2017 Elsevier Patient Education  [place].

## 2020-02-11 ENCOUNTER — Other Ambulatory Visit: Payer: Self-pay

## 2020-02-11 ENCOUNTER — Encounter (HOSPITAL_COMMUNITY)
Admission: RE | Admit: 2020-02-11 | Discharge: 2020-02-11 | Disposition: A | Discharge status: Home / Self Care | Payer: Medicaid Other | Source: Ambulatory Visit | Attending: General Surgery | Admitting: General Surgery

## 2020-02-11 ENCOUNTER — Other Ambulatory Visit (HOSPITAL_COMMUNITY)
Admission: RE | Admit: 2020-02-11 | Discharge: 2020-02-11 | Disposition: A | Discharge status: Home / Self Care | Payer: Medicaid Other | Source: Ambulatory Visit | Attending: General Surgery | Admitting: General Surgery

## 2020-02-11 ENCOUNTER — Encounter (HOSPITAL_COMMUNITY): Payer: Self-pay

## 2020-02-11 DIAGNOSIS — Z20822 Contact with and (suspected) exposure to covid-19: Secondary | ICD-10-CM | POA: Diagnosis not present

## 2020-02-11 DIAGNOSIS — Z01812 Encounter for preprocedural laboratory examination: Secondary | ICD-10-CM | POA: Diagnosis not present

## 2020-02-11 LAB — SARS CORONAVIRUS 2 (TAT 6-24 HRS): SARS Coronavirus 2: NEGATIVE

## 2020-02-12 ENCOUNTER — Telehealth: Payer: Self-pay | Admitting: Family Medicine

## 2020-02-12 NOTE — Telephone Encounter (Signed)
Patient would like results of Covid test. °

## 2020-02-12 NOTE — Telephone Encounter (Signed)
Covid test negative

## 2020-02-12 NOTE — Telephone Encounter (Signed)
Patient was notified that her Covid test was negative. Patient verbalized understanding.

## 2020-02-13 ENCOUNTER — Ambulatory Visit (HOSPITAL_COMMUNITY): Payer: Medicaid Other | Admitting: Anesthesiology

## 2020-02-13 ENCOUNTER — Encounter (HOSPITAL_COMMUNITY): Payer: Self-pay | Admitting: General Surgery

## 2020-02-13 ENCOUNTER — Encounter (HOSPITAL_COMMUNITY)
Admission: RE | Disposition: A | Discharge status: Home / Self Care | Payer: Self-pay | Source: Home / Self Care | Attending: General Surgery

## 2020-02-13 ENCOUNTER — Other Ambulatory Visit: Payer: Self-pay

## 2020-02-13 ENCOUNTER — Ambulatory Visit (HOSPITAL_COMMUNITY)
Admission: RE | Admit: 2020-02-13 | Discharge: 2020-02-13 | Disposition: A | Discharge status: Home / Self Care | Payer: Medicaid Other | Attending: General Surgery | Admitting: General Surgery

## 2020-02-13 DIAGNOSIS — Z79899 Other long term (current) drug therapy: Secondary | ICD-10-CM | POA: Diagnosis not present

## 2020-02-13 DIAGNOSIS — F17210 Nicotine dependence, cigarettes, uncomplicated: Secondary | ICD-10-CM | POA: Insufficient documentation

## 2020-02-13 DIAGNOSIS — K8010 Calculus of gallbladder with chronic cholecystitis without obstruction: Secondary | ICD-10-CM | POA: Insufficient documentation

## 2020-02-13 DIAGNOSIS — M419 Scoliosis, unspecified: Secondary | ICD-10-CM | POA: Insufficient documentation

## 2020-02-13 DIAGNOSIS — Z793 Long term (current) use of hormonal contraceptives: Secondary | ICD-10-CM | POA: Insufficient documentation

## 2020-02-13 DIAGNOSIS — K8020 Calculus of gallbladder without cholecystitis without obstruction: Secondary | ICD-10-CM | POA: Diagnosis not present

## 2020-02-13 DIAGNOSIS — Z86718 Personal history of other venous thrombosis and embolism: Secondary | ICD-10-CM | POA: Insufficient documentation

## 2020-02-13 HISTORY — PX: CHOLECYSTECTOMY: SHX55

## 2020-02-13 LAB — HCG, SERUM, QUALITATIVE: Preg, Serum: NEGATIVE

## 2020-02-13 SURGERY — LAPAROSCOPIC CHOLECYSTECTOMY
Anesthesia: General | Site: Abdomen

## 2020-02-13 MED ORDER — SUCCINYLCHOLINE CHLORIDE 200 MG/10ML IV SOSY
PREFILLED_SYRINGE | INTRAVENOUS | Status: DC | PRN
  Administered 2020-02-13: 100 mg via INTRAVENOUS

## 2020-02-13 MED ORDER — DEXAMETHASONE SODIUM PHOSPHATE 10 MG/ML IJ SOLN
INTRAMUSCULAR | Status: DC | PRN
  Administered 2020-02-13: 10 mg via INTRAVENOUS

## 2020-02-13 MED ORDER — PROMETHAZINE HCL 25 MG/ML IJ SOLN: 6.2500 mg | INTRAMUSCULAR | Status: DC | PRN

## 2020-02-13 MED ORDER — CHLORHEXIDINE GLUCONATE CLOTH 2 % EX PADS: 6.0000 | MEDICATED_PAD | Freq: Once | CUTANEOUS | Status: DC

## 2020-02-13 MED ORDER — ROCURONIUM BROMIDE 100 MG/10ML IV SOLN
INTRAVENOUS | Status: DC | PRN
  Administered 2020-02-13: 50 mg via INTRAVENOUS

## 2020-02-13 MED ORDER — KETOROLAC TROMETHAMINE 30 MG/ML IJ SOLN
30.0000 mg | Freq: Once | INTRAMUSCULAR | Status: AC
  Administered 2020-02-13: 30 mg via INTRAVENOUS
  Filled 2020-02-13: qty 1

## 2020-02-13 MED ORDER — MIDAZOLAM HCL 2 MG/2ML IJ SOLN
INTRAMUSCULAR | Status: AC
  Filled 2020-02-13: qty 2

## 2020-02-13 MED ORDER — DEXAMETHASONE SODIUM PHOSPHATE 10 MG/ML IJ SOLN
INTRAMUSCULAR | Status: AC
  Filled 2020-02-13: qty 1

## 2020-02-13 MED ORDER — SUGAMMADEX SODIUM 500 MG/5ML IV SOLN
INTRAVENOUS | Status: DC | PRN
  Administered 2020-02-13: 355.6 mg via INTRAVENOUS

## 2020-02-13 MED ORDER — MENTHOL 3 MG MT LOZG
1.0000 | LOZENGE | OROMUCOSAL | Status: DC | PRN
  Administered 2020-02-13: 3 mg via ORAL
  Filled 2020-02-13: qty 9

## 2020-02-13 MED ORDER — HYDROCODONE-ACETAMINOPHEN 5-325 MG PO TABS
ORAL_TABLET | ORAL | Status: AC
  Filled 2020-02-13: qty 1

## 2020-02-13 MED ORDER — LIDOCAINE 2% (20 MG/ML) 5 ML SYRINGE
INTRAMUSCULAR | Status: AC
  Filled 2020-02-13: qty 5

## 2020-02-13 MED ORDER — BUPIVACAINE LIPOSOME 1.3 % IJ SUSP
INTRAMUSCULAR | Status: DC | PRN
  Administered 2020-02-13: 20 mL

## 2020-02-13 MED ORDER — ONDANSETRON HCL 4 MG/2ML IJ SOLN
INTRAMUSCULAR | Status: AC
  Filled 2020-02-13: qty 2

## 2020-02-13 MED ORDER — BUPIVACAINE LIPOSOME 1.3 % IJ SUSP
INTRAMUSCULAR | Status: AC
  Filled 2020-02-13: qty 20

## 2020-02-13 MED ORDER — SUCCINYLCHOLINE CHLORIDE 200 MG/10ML IV SOSY
PREFILLED_SYRINGE | INTRAVENOUS | Status: AC
  Filled 2020-02-13: qty 10

## 2020-02-13 MED ORDER — ONDANSETRON HCL 4 MG/2ML IJ SOLN
INTRAMUSCULAR | Status: DC | PRN
  Administered 2020-02-13: 4 mg via INTRAVENOUS

## 2020-02-13 MED ORDER — LACTATED RINGERS IV SOLN: INTRAVENOUS | Status: DC

## 2020-02-13 MED ORDER — HYDROCODONE-ACETAMINOPHEN 5-325 MG PO TABS
1.0000 | ORAL_TABLET | Freq: Once | ORAL | Status: AC
  Administered 2020-02-13: 10:00:00 1 via ORAL

## 2020-02-13 MED ORDER — LIDOCAINE HCL (CARDIAC) PF 100 MG/5ML IV SOSY
PREFILLED_SYRINGE | INTRAVENOUS | Status: DC | PRN
  Administered 2020-02-13: 100 mg via INTRAVENOUS

## 2020-02-13 MED ORDER — SODIUM CHLORIDE 0.9 % IR SOLN
Status: DC | PRN
  Administered 2020-02-13: 1000 mL

## 2020-02-13 MED ORDER — FENTANYL CITRATE (PF) 250 MCG/5ML IJ SOLN
INTRAMUSCULAR | Status: AC
  Filled 2020-02-13: qty 5

## 2020-02-13 MED ORDER — CIPROFLOXACIN IN D5W 400 MG/200ML IV SOLN
INTRAVENOUS | Status: AC
  Filled 2020-02-13: qty 200

## 2020-02-13 MED ORDER — MIDAZOLAM HCL 5 MG/5ML IJ SOLN
INTRAMUSCULAR | Status: DC | PRN
  Administered 2020-02-13: 2 mg via INTRAVENOUS

## 2020-02-13 MED ORDER — PROPOFOL 10 MG/ML IV BOLUS
INTRAVENOUS | Status: DC | PRN
  Administered 2020-02-13: 150 mg via INTRAVENOUS

## 2020-02-13 MED ORDER — FENTANYL CITRATE (PF) 100 MCG/2ML IJ SOLN
INTRAMUSCULAR | Status: DC | PRN
  Administered 2020-02-13: 50 ug via INTRAVENOUS
  Administered 2020-02-13 (×2): 100 ug via INTRAVENOUS

## 2020-02-13 MED ORDER — MIDAZOLAM HCL 2 MG/2ML IJ SOLN: 0.5000 mg | Freq: Once | INTRAMUSCULAR | Status: DC | PRN

## 2020-02-13 MED ORDER — ONDANSETRON HCL 4 MG PO TABS: 4.0000 mg | ORAL_TABLET | Freq: Three times a day (TID) | ORAL | 1 refills | Status: DC | PRN

## 2020-02-13 MED ORDER — CIPROFLOXACIN IN D5W 400 MG/200ML IV SOLN
400.0000 mg | INTRAVENOUS | Status: AC
  Administered 2020-02-13: 09:00:00 400 mg via INTRAVENOUS
  Filled 2020-02-13: qty 200

## 2020-02-13 MED ORDER — SUGAMMADEX SODIUM 500 MG/5ML IV SOLN
INTRAVENOUS | Status: AC
  Filled 2020-02-13: qty 5

## 2020-02-13 MED ORDER — OXYCODONE-ACETAMINOPHEN 7.5-325 MG PO TABS: 1.0000 | ORAL_TABLET | Freq: Four times a day (QID) | ORAL | 0 refills | Status: DC | PRN

## 2020-02-13 MED ORDER — HEMOSTATIC AGENTS (NO CHARGE) OPTIME
TOPICAL | Status: DC | PRN
  Administered 2020-02-13: 1 via TOPICAL

## 2020-02-13 SURGICAL SUPPLY — 50 items
ADH SKN CLS APL DERMABOND .7 (GAUZE/BANDAGES/DRESSINGS) ×1
APL PRP STRL LF DISP 70% ISPRP (MISCELLANEOUS) ×1
APL SRG 38 LTWT LNG FL B (MISCELLANEOUS)
APPLICATOR ARISTA FLEXITIP XL (MISCELLANEOUS) IMPLANT
APPLIER CLIP ROT 10 11.4 M/L (STAPLE) ×3
APR CLP MED LRG 11.4X10 (STAPLE) ×1
BAG RETRIEVAL 10 (BASKET) ×1
BAG RETRIEVAL 10MM (BASKET) ×1
CHLORAPREP W/TINT 26 (MISCELLANEOUS) ×3 IMPLANT
CLIP APPLIE ROT 10 11.4 M/L (STAPLE) ×1 IMPLANT
CLOTH BEACON ORANGE TIMEOUT ST (SAFETY) ×3 IMPLANT
COVER LIGHT HANDLE STERIS (MISCELLANEOUS) ×6 IMPLANT
COVER WAND RF STERILE (DRAPES) ×3 IMPLANT
DERMABOND ADVANCED (GAUZE/BANDAGES/DRESSINGS) ×2
DERMABOND ADVANCED .7 DNX12 (GAUZE/BANDAGES/DRESSINGS) ×1 IMPLANT
ELECT REM PT RETURN 9FT ADLT (ELECTROSURGICAL) ×3
ELECTRODE REM PT RTRN 9FT ADLT (ELECTROSURGICAL) ×1 IMPLANT
GLOVE BIO SURGEON STRL SZ7 (GLOVE) ×2 IMPLANT
GLOVE BIOGEL PI IND STRL 7.0 (GLOVE) ×2 IMPLANT
GLOVE BIOGEL PI INDICATOR 7.0 (GLOVE) ×4
GLOVE ECLIPSE 6.5 STRL STRAW (GLOVE) ×2 IMPLANT
GLOVE SURG SS PI 7.5 STRL IVOR (GLOVE) ×3 IMPLANT
GOWN STRL REUS W/TWL LRG LVL3 (GOWN DISPOSABLE) ×9 IMPLANT
HEMOSTAT ARISTA ABSORB 3G PWDR (HEMOSTASIS) IMPLANT
HEMOSTAT SNOW SURGICEL 2X4 (HEMOSTASIS) ×3 IMPLANT
INST SET LAPROSCOPIC AP (KITS) ×3 IMPLANT
KIT TURNOVER KIT A (KITS) ×3 IMPLANT
MANIFOLD NEPTUNE II (INSTRUMENTS) ×3 IMPLANT
NDL HYPO 18GX1.5 BLUNT FILL (NEEDLE) ×1 IMPLANT
NDL INSUFFLATION 14GA 120MM (NEEDLE) ×1 IMPLANT
NEEDLE HYPO 18GX1.5 BLUNT FILL (NEEDLE) ×3 IMPLANT
NEEDLE HYPO 22GX1.5 SAFETY (NEEDLE) ×3 IMPLANT
NEEDLE INSUFFLATION 14GA 120MM (NEEDLE) ×3 IMPLANT
NS IRRIG 1000ML POUR BTL (IV SOLUTION) ×3 IMPLANT
PACK LAP CHOLE LZT030E (CUSTOM PROCEDURE TRAY) ×3 IMPLANT
PAD ARMBOARD 7.5X6 YLW CONV (MISCELLANEOUS) ×3 IMPLANT
SET BASIN LINEN APH (SET/KITS/TRAYS/PACK) ×3 IMPLANT
SET TUBE SMOKE EVAC HIGH FLOW (TUBING) ×3 IMPLANT
SLEEVE ENDOPATH XCEL 5M (ENDOMECHANICALS) ×3 IMPLANT
SUT MNCRL AB 4-0 PS2 18 (SUTURE) ×6 IMPLANT
SUT VICRYL 0 UR6 27IN ABS (SUTURE) ×3 IMPLANT
SYR 20ML LL LF (SYRINGE) ×6 IMPLANT
SYS BAG RETRIEVAL 10MM (BASKET) ×1
SYSTEM BAG RETRIEVAL 10MM (BASKET) ×1 IMPLANT
TROCAR ENDO BLADELESS 11MM (ENDOMECHANICALS) ×3 IMPLANT
TROCAR XCEL NON-BLD 5MMX100MML (ENDOMECHANICALS) ×3 IMPLANT
TROCAR XCEL UNIV SLVE 11M 100M (ENDOMECHANICALS) ×3 IMPLANT
TUBE CONNECTING 12'X1/4 (SUCTIONS) ×1
TUBE CONNECTING 12X1/4 (SUCTIONS) ×2 IMPLANT
WARMER LAPAROSCOPE (MISCELLANEOUS) ×3 IMPLANT

## 2020-02-13 NOTE — Anesthesia Preprocedure Evaluation (Signed)
Anesthesia Evaluation  Patient identified by MRN, date of birth, ID band Patient awake    Reviewed: Allergy & Precautions, NPO status , Patient's Chart, lab work & pertinent test results  Airway Mallampati: II  TM Distance: >3 FB Neck ROM: Full    Dental no notable dental hx. (+) Teeth Intact   Pulmonary neg pulmonary ROS, Current Smoker and Patient abstained from smoking.,    Pulmonary exam normal breath sounds clear to auscultation       Cardiovascular Exercise Tolerance: Good negative cardio ROS Normal cardiovascular examI Rhythm:Regular Rate:Normal     Neuro/Psych negative neurological ROS  negative psych ROS   GI/Hepatic negative GI ROS, Neg liver ROS,   Endo/Other  negative endocrine ROS  Renal/GU negative Renal ROS  negative genitourinary   Musculoskeletal negative musculoskeletal ROS (+)   Abdominal   Peds negative pediatric ROS (+)  Hematology negative hematology ROS (+) H/H normal    Anesthesia Other Findings   Reproductive/Obstetrics negative OB ROS                             Anesthesia Physical Anesthesia Plan  ASA: II  Anesthesia Plan: General   Post-op Pain Management:    Induction: Intravenous  PONV Risk Score and Plan: 2 and Ondansetron and Dexamethasone  Airway Management Planned: Oral ETT  Additional Equipment:   Intra-op Plan:   Post-operative Plan: Extubation in OR  Informed Consent: I have reviewed the patients History and Physical, chart, labs and discussed the procedure including the risks, benefits and alternatives for the proposed anesthesia with the patient or authorized representative who has indicated his/her understanding and acceptance.     Dental advisory given  Plan Discussed with: CRNA  Anesthesia Plan Comments: (Plan Full PPE use Plan GETA D/W PT -WTP with same after Q&A)        Anesthesia Quick Evaluation

## 2020-02-13 NOTE — Anesthesia Procedure Notes (Signed)
Procedure Name: Intubation Date/Time: 02/13/2020 9:28 AM Performed by: Jonna Munro, CRNA Pre-anesthesia Checklist: Patient identified, Emergency Drugs available, Suction available, Patient being monitored and Timeout performed Patient Re-evaluated:Patient Re-evaluated prior to induction Oxygen Delivery Method: Circle system utilized Preoxygenation: Pre-oxygenation with 100% oxygen Induction Type: IV induction and Rapid sequence Laryngoscope Size: Mac and 3 Grade View: Grade I Tube type: Oral Tube size: 7.0 mm Number of attempts: 1 Airway Equipment and Method: Stylet Placement Confirmation: ETT inserted through vocal cords under direct vision,  positive ETCO2 and breath sounds checked- equal and bilateral Secured at: 22 cm Tube secured with: Tape Dental Injury: Teeth and Oropharynx as per pre-operative assessment

## 2020-02-13 NOTE — Transfer of Care (Signed)
Immediate Anesthesia Transfer of Care Note  Patient: Joanna Higgins  Procedure(s) Performed: LAPAROSCOPIC CHOLECYSTECTOMY (N/A Abdomen)  Patient Location: PACU  Anesthesia Type:General  Level of Consciousness: awake, alert , oriented and patient cooperative  Airway & Oxygen Therapy: Patient Spontanous Breathing and Patient connected to face mask oxygen  Post-op Assessment: Report given to RN, Post -op Vital signs reviewed and stable and Patient moving all extremities X 4  Post vital signs: Reviewed and stable  Last Vitals:  Vitals Value Taken Time  BP    Temp    Pulse    Resp    SpO2      Last Pain:  Vitals:   02/13/20 0747  TempSrc: Oral  PainSc: 6          Complications: No apparent anesthesia complications

## 2020-02-13 NOTE — Interval H&P Note (Signed)
History and Physical Interval Note:  02/13/2020 8:55 AM  Joanna Higgins  has presented today for surgery, with the diagnosis of Cholelithiasis.  The various methods of treatment have been discussed with the patient and family. After consideration of risks, benefits and other options for treatment, the patient has consented to  Procedure(s): LAPAROSCOPIC CHOLECYSTECTOMY (N/A) as a surgical intervention.  The patient's history has been reviewed, patient examined, no change in status, stable for surgery.  I have reviewed the patient's chart and labs.  Questions were answered to the patient's satisfaction.     [name]   

## 2020-02-13 NOTE — Anesthesia Postprocedure Evaluation (Signed)
Anesthesia Post Note  Patient: Joanna Higgins  Procedure(s) Performed: LAPAROSCOPIC CHOLECYSTECTOMY (N/A Abdomen)  Patient location during evaluation: PACU Anesthesia Type: General Level of consciousness: awake, oriented, awake and alert and patient cooperative Pain management: pain level controlled Vital Signs Assessment: post-procedure vital signs reviewed and stable Respiratory status: spontaneous breathing, respiratory function stable and nonlabored ventilation Cardiovascular status: stable Postop Assessment: no apparent nausea or vomiting Anesthetic complications: no     Last Vitals:  Vitals:   02/13/20 0747  BP: (!) 105/59  Pulse: (!) 112  Resp: 17  Temp: 36.9 C  SpO2: 99%    Last Pain:  Vitals:   02/13/20 0747  TempSrc: Oral  PainSc: 6                  Katelyne Galster

## 2020-02-13 NOTE — Interval H&P Note (Signed)
History and Physical Interval Note:  02/13/2020 8:55 AM  Joanna Higgins  has presented today for surgery, with the diagnosis of Cholelithiasis.  The various methods of treatment have been discussed with the patient and family. After consideration of risks, benefits and other options for treatment, the patient has consented to  Procedure(s): LAPAROSCOPIC CHOLECYSTECTOMY (N/A) as a surgical intervention.  The patient's history has been reviewed, patient examined, no change in status, stable for surgery.  I have reviewed the patient's chart and labs.  Questions were answered to the patient's satisfaction.     Franky Macho

## 2020-02-13 NOTE — Discharge Instructions (Signed)
Laparoscopic Cholecystectomy, Care After This sheet gives you information about how to care for yourself after your procedure. Your health care provider may also give you more specific instructions. If you have problems or questions, contact your health care provider. What can I expect after the procedure? After the procedure, it is common to have:  Pain at your incision sites. You will be given medicines to control this pain.  Mild nausea or vomiting.  Bloating and possible shoulder pain from the air-like gas that was used during the procedure. Follow these instructions at home: Incision care   Follow instructions from your health care provider about how to take care of your incisions. Make sure you: ? Wash your hands with soap and water before you change your bandage (dressing). If soap and water are not available, use hand sanitizer. ? Change your dressing as told by your health care provider. ? Leave stitches (sutures), skin glue, or adhesive strips in place. These skin closures may need to be in place for 2 weeks or longer. If adhesive strip edges start to loosen and curl up, you may trim the loose edges. Do not remove adhesive strips completely unless your health care provider tells you to do that.  Do not take baths, swim, or use a hot tub until your health care provider approves. Ask your health care provider if you can take showers. You may only be allowed to take sponge baths for bathing.  Check your incision area every day for signs of infection. Check for: ? More redness, swelling, or pain. ? More fluid or blood. ? Warmth. ? Pus or a bad smell. Activity  Do not drive or use heavy machinery while taking prescription pain medicine.  Do not lift anything that is heavier than 10 lb (4.5 kg) until your health care provider approves.  Do not play contact sports until your health care provider approves.  Do not drive for 24 hours if you were given a medicine to help you relax  (sedative).  Rest as needed. Do not return to work or school until your health care provider approves. General instructions  Take over-the-counter and prescription medicines only as told by your health care provider.  To prevent or treat constipation while you are taking prescription pain medicine, your health care provider may recommend that you: ? Drink enough fluid to keep your urine clear or pale yellow. ? Take over-the-counter or prescription medicines. ? Eat foods that are high in fiber, such as fresh fruits and vegetables, whole grains, and beans. ? Limit foods that are high in fat and processed sugars, such as fried and sweet foods. Contact a health care provider if:  You develop a rash.  You have more redness, swelling, or pain around your incisions.  You have more fluid or blood coming from your incisions.  Your incisions feel warm to the touch.  You have pus or a bad smell coming from your incisions.  You have a fever.  One or more of your incisions breaks open. Get help right away if:  You have trouble breathing.  You have chest pain.  You have increasing pain in your shoulders.  You faint or feel dizzy when you stand.  You have severe pain in your abdomen.  You have nausea or vomiting that lasts for more than one day.  You have leg pain. This information is not intended to replace advice given to you by your health care provider. Make sure you discuss any questions you have   with your health care provider. Document Revised: 11/25/2017 Document Reviewed: 05/31/2016 Elsevier Patient Education  2020 [employer]     General Anesthesia, Adult, Care After This sheet gives you information about how to care for yourself after your procedure. Your health care provider may also give you more specific instructions. If you have problems or questions, contact your health care provider. What can I expect after the procedure? After the procedure, the following side  effects are common:  Pain or discomfort at the IV site.  Nausea.  Vomiting.  Sore throat.  Trouble concentrating.  Feeling cold or chills.  Weak or tired.  Sleepiness and fatigue.  Soreness and body aches. These side effects can affect parts of the body that were not involved in surgery. Follow these instructions at home:  For at least 24 hours after the procedure:  Have a responsible adult stay with you. It is important to have someone help care for you until you are awake and alert.  Rest as needed.  Do not: ? Participate in activities in which you could fall or become injured. ? Drive. ? Use heavy machinery. ? Drink alcohol. ? Take sleeping pills or medicines that cause drowsiness. ? Make important decisions or sign legal documents. ? Take care of children on your own. Eating and drinking  Follow any instructions from your health care provider about eating or drinking restrictions.  When you feel hungry, start by eating small amounts of foods that are soft and easy to digest (bland), such as toast. Gradually return to your regular diet.  Drink enough fluid to keep your urine pale yellow.  If you vomit, rehydrate by drinking water, juice, or clear broth. General instructions  If you have sleep apnea, surgery and certain medicines can increase your risk for breathing problems. Follow instructions from your health care provider about wearing your sleep device: ? Anytime you are sleeping, including during daytime naps. ? While taking prescription pain medicines, sleeping medicines, or medicines that make you drowsy.  Return to your normal activities as told by your health care provider. Ask your health care provider what activities are safe for you.  Take over-the-counter and prescription medicines only as told by your health care provider.  If you smoke, do not smoke without supervision.  Keep all follow-up visits as told by your health care provider. This is  important. Contact a health care provider if:  You have nausea or vomiting that does not get better with medicine.  You cannot eat or drink without vomiting.  You have pain that does not get better with medicine.  You are unable to pass urine.  You develop a skin rash.  You have a fever.  You have redness around your IV site that gets worse. Get help right away if:  You have difficulty breathing.  You have chest pain.  You have blood in your urine or stool, or you vomit blood. Summary  After the procedure, it is common to have a sore throat or nausea. It is also common to feel tired.  Have a responsible adult stay with you for the first 24 hours after general anesthesia. It is important to have someone help care for you until you are awake and alert.  When you feel hungry, start by eating small amounts of foods that are soft and easy to digest (bland), such as toast. Gradually return to your regular diet.  Drink enough fluid to keep your urine pale yellow.  Return to your normal  activities as told by your health care provider. Ask your health care provider what activities are safe for you. This information is not intended to replace advice given to you by your health care provider. Make sure you discuss any questions you have with your health care provider. Document Revised: 12/16/2017 Document Reviewed: 07/29/2017 Elsevier Patient Education  [place].     Please do not remove the teal exparel bracelet until Sunday 02/17/2020 around 12 noon. Do not take any numbing medications until after Sunday

## 2020-02-13 NOTE — Op Note (Signed)
Patient:  Joanna Higgins  DOB:  01-31-2001  MRN:  [id number]   Preop Diagnosis: Biliary colic, cholelithiasis  Postop Diagnosis: Same  Procedure: Laparoscopic cholecystectomy  Surgeon: Franky Macho, MD  Anes: General endotracheal  Indications: Patient is a 19 year old white female who presents with biliary colic secondary to cholelithiasis.  The risks and benefits of the procedure including bleeding, infection, hepatobiliary injury, the possibility of an open procedure were fully explained to the patient, who gave informed consent.  Procedure note: The patient was placed in the supine position.  After induction of general endotracheal anesthesia, the abdomen was prepped and draped using the usual sterile technique with ChloraPrep.  Surgical site confirmation was performed.  A supraumbilical incision was made down to the fascia.  A Veress needle was introduced into the abdominal cavity and confirmation of placement was done using the saline drop test.  The abdomen was then insufflated to 15 mmHg pressure.  An 11 mm trocar was introduced into the abdominal cavity under direct visualization without difficulty.  The patient was placed in reverse Trendelenburg position and an additional 1 mm trocar was placed in the epigastric region and a 5 mm trochars were placed the right upper quadrant and right flank regions.  Liver was inspected noted to be within normal limits.  The gallbladder was retracted in a dynamic fashion in order to provide a critical view of the triangle of [place]ot.  The cystic duct was first identified.  Its juncture to the infundibulum was fully identified.  Endoclips were placed proximally and distally on the cystic duct, and the cystic duct was divided.  This was likewise done to the cystic artery.  The gallbladder was freed away from the gallbladder fossa using Bovie electrocautery.  The gallbladder was delivered through the epigastric trocar site using an Endo Catch bag.  The  gallbladder fossa was inspected and no abnormal bleeding or bile leakage was noted.  Surgicel was placed in the gallbladder fossa.  All fluid and air were then evacuated from the abdominal cavity prior to the removal of the trochars.  All wounds were irrigated with normal saline.  All wounds were injected with Exparel.  The supraumbilical fascia was reapproximated using an 0 Vicryl interrupted suture.  All skin incisions were closed using 4-0 Monocryl subcuticular suture.  Dermabond was applied.  All tape and needle counts were correct at the end of the procedure.  The patient was extubated in the operating room and transferred to PACU in stable condition.  Complications: None  EBL: Minimal  Specimen: Gallbladder

## 2020-02-14 LAB — SURGICAL PATHOLOGY

## 2020-02-14 NOTE — Progress Notes (Signed)
Pt called and said she tried to contact Dr. Lovell Sheehan, unable to reach the office due to wrong number. Pt called Short Stay and said she's not been able to sleep and having pain to the back rated 10/10. Dr. Lovell Sheehan notified and will call patient to address her concerns.

## 2020-02-19 ENCOUNTER — Telehealth: Payer: Self-pay

## 2020-02-19 NOTE — Telephone Encounter (Signed)
Patient came into office today to let someone look at her incision- she stated she had pus, no pus was present-glue still intact- patient complained of bloating and constipation- let her know she could try miralax and stool softner until bowel movement. Increase water- denies having fever. No redness present around any incisions.   Patient told Dr.Jenkins would call her according to discharge note.

## 2020-02-21 ENCOUNTER — Telehealth (INDEPENDENT_AMBULATORY_CARE_PROVIDER_SITE_OTHER): Payer: Self-pay | Admitting: General Surgery

## 2020-02-21 DIAGNOSIS — Z09 Encounter for follow-up examination after completed treatment for conditions other than malignant neoplasm: Secondary | ICD-10-CM

## 2020-02-22 NOTE — Progress Notes (Signed)
Subjective:     Joanna Higgins  Telephone postoperative visit performed.  Patient states she is doing much better.  She denies any drainage from her incisions.  She denies any fever, chills, nausea, or vomiting.  She has increased her activity as able.  States her preoperative pain has resolved. Objective:    There were no vitals taken for this visit.  General:  cooperative and no distress       Assessment:    Doing well postoperatively.    Plan:   May increase activity as able.  She was instructed to call me should she have any difficulties.

## 2020-03-05 ENCOUNTER — Other Ambulatory Visit: Payer: Self-pay

## 2020-03-05 ENCOUNTER — Ambulatory Visit (INDEPENDENT_AMBULATORY_CARE_PROVIDER_SITE_OTHER): Payer: Medicaid Other | Admitting: Family Medicine

## 2020-03-05 DIAGNOSIS — K529 Noninfective gastroenteritis and colitis, unspecified: Secondary | ICD-10-CM

## 2020-03-05 MED ORDER — ONDANSETRON 4 MG PO TBDP: 4.0000 mg | ORAL_TABLET | Freq: Three times a day (TID) | ORAL | 0 refills | Status: DC | PRN

## 2020-03-05 NOTE — Progress Notes (Signed)
   Subjective:  Audio plus video to  Patient ID: Joanna Higgins, female    DOB: 2001/11/15, 19 y.o.   MRN: [id number]  Sinusitis This is a new problem. The current episode started today. There has been no fever. Associated symptoms include congestion. (Vomiting constantly today) Past treatments include nothing.   Virtual Visit via Telephone Note  I connected with KENSEY LUEPKE on 03/05/20 at  3:50 PM EST by telephone and verified that I am speaking with the correct person using two identifiers.  Location: Patient: home Provider: office   I discussed the limitations, risks, security and privacy concerns of performing an evaluation and management service by telephone and the availability of in person appointments. I also discussed with the patient that there may be a patient responsible charge related to this service. The patient expressed understanding and agreed to proceed.   History of Present Illness:    Observations/Objective:   Assessment and Plan:   Follow Up Instructions:    I discussed the assessment and treatment plan with the patient. The patient was provided an opportunity to ask questions and all were answered. The patient agreed with the plan and demonstrated an understanding of the instructions.   The patient was advised to call back or seek an in-person evaluation if the symptoms worsen or if the condition fails to improve as anticipated.  I provided 22 minutes of non-face-to-face time during this encounter.   Started this morning  Patient has had substantial nausea and vomiting.  She reports her throat is irritated due to this.  No fever.  No headache.  No sore throat.  No cough.  Woke up With everything today.  Sudden onset. Throat was irritated earlier after vomiting.  Notes ongoing substantial nausea.     Review of Systems  HENT: Positive for congestion.        Objective:   Physical Exam   Virtual     Assessment & Plan:  Impression  acute gastroenteritis.  Patient reports not been around anyone else sick with GI symptoms.  Also no recent new foods.  Warning signs discussed.  Dietary intervention discussed.  Nausea medication prescribed

## 2020-03-31 ENCOUNTER — Encounter (HOSPITAL_COMMUNITY): Payer: Self-pay

## 2020-03-31 ENCOUNTER — Other Ambulatory Visit: Payer: Self-pay

## 2020-03-31 ENCOUNTER — Emergency Department (HOSPITAL_COMMUNITY)
Admission: EM | Admit: 2020-03-31 | Discharge: 2020-03-31 | Discharge status: Against Medical Advice | Payer: Medicaid Other | Attending: Emergency Medicine | Admitting: Emergency Medicine

## 2020-03-31 ENCOUNTER — Ambulatory Visit (INDEPENDENT_AMBULATORY_CARE_PROVIDER_SITE_OTHER): Payer: Medicaid Other | Admitting: Family Medicine

## 2020-03-31 DIAGNOSIS — N3001 Acute cystitis with hematuria: Secondary | ICD-10-CM | POA: Diagnosis not present

## 2020-03-31 DIAGNOSIS — F17210 Nicotine dependence, cigarettes, uncomplicated: Secondary | ICD-10-CM | POA: Insufficient documentation

## 2020-03-31 DIAGNOSIS — R000 Tachycardia, unspecified: Secondary | ICD-10-CM | POA: Diagnosis not present

## 2020-03-31 DIAGNOSIS — R519 Headache, unspecified: Secondary | ICD-10-CM | POA: Diagnosis not present

## 2020-03-31 DIAGNOSIS — R319 Hematuria, unspecified: Secondary | ICD-10-CM | POA: Diagnosis not present

## 2020-03-31 DIAGNOSIS — R1111 Vomiting without nausea: Secondary | ICD-10-CM | POA: Diagnosis not present

## 2020-03-31 DIAGNOSIS — R102 Pelvic and perineal pain: Secondary | ICD-10-CM | POA: Insufficient documentation

## 2020-03-31 DIAGNOSIS — Z793 Long term (current) use of hormonal contraceptives: Secondary | ICD-10-CM | POA: Insufficient documentation

## 2020-03-31 DIAGNOSIS — R531 Weakness: Secondary | ICD-10-CM | POA: Diagnosis present

## 2020-03-31 DIAGNOSIS — R58 Hemorrhage, not elsewhere classified: Secondary | ICD-10-CM | POA: Diagnosis not present

## 2020-03-31 LAB — CBC
HCT: 43.8 % (ref 36.0–46.0)
Hemoglobin: 14.1 g/dL (ref 12.0–15.0)
MCH: 30.5 pg (ref 26.0–34.0)
MCHC: 32.2 g/dL (ref 30.0–36.0)
MCV: 94.8 fL (ref 80.0–100.0)
Platelets: 253 10*3/uL (ref 150–400)
RBC: 4.62 MIL/uL (ref 3.87–5.11)
RDW: 13.3 % (ref 11.5–15.5)
WBC: 10.3 10*3/uL (ref 4.0–10.5)
nRBC: 0 % (ref 0.0–0.2)

## 2020-03-31 LAB — BASIC METABOLIC PANEL
Anion gap: 9 (ref 5–15)
BUN: 13 mg/dL (ref 6–20)
CO2: 23 mmol/L (ref 22–32)
Calcium: 8.9 mg/dL (ref 8.9–10.3)
Chloride: 105 mmol/L (ref 98–111)
Creatinine, Ser: 0.62 mg/dL (ref 0.44–1.00)
GFR calc Af Amer: >60 mL/min (ref >60)
GFR calc non Af Amer: >60 mL/min (ref >60)
Glucose, Bld: 79 mg/dL (ref 70–99)
Potassium: 4 mmol/L (ref 3.5–5.1)
Sodium: 137 mmol/L (ref 135–145)

## 2020-03-31 LAB — HCG, QUANTITATIVE, PREGNANCY: hCG, Beta Chain, Quant, S: <1 m[IU]/mL (ref <5)

## 2020-03-31 MED ORDER — CIPROFLOXACIN HCL 250 MG PO TABS: 250.0000 mg | ORAL_TABLET | Freq: Two times a day (BID) | ORAL | 0 refills | Status: DC

## 2020-03-31 NOTE — ED Triage Notes (Addendum)
Pt has been feeling light headed and bleeding vaginally for the last week. Pt got Nexplanon implanted 2 months ago. Has had some nasal congestion. Afebrile. CBG 96. HR 115.

## 2020-03-31 NOTE — ED Provider Notes (Signed)
[facility] EMERGENCY DEPARTMENT Provider Note   CSN: [id number] Arrival date & time: 03/31/20  1437     History Chief Complaint  Patient presents with  . Weakness    Joanna Higgins is a 19 y.o. female.  Patient complains of some weakness and blood in her urine.  The history is provided by the patient and medical records.  Weakness Severity:  Mild Onset quality:  Sudden Timing:  Constant Progression:  Worsening Chronicity:  New Context: not alcohol use   Relieved by:  Nothing Worsened by:  Nothing Ineffective treatments:  None tried Associated symptoms: abdominal pain   Associated symptoms: no chest pain, no cough, no diarrhea, no frequency, no headaches and no seizures        Past Medical History:  Diagnosis Date  . Bladder instability   . Bulging lumbar disc   . DIC (disseminated intravascular coagulation) (HCC)   . DVT (deep venous thrombosis) (HCC)    2016  . Irregular menstrual bleeding 02/10/2016  . Scoliosis   . Vascular malformation     Patient Active Problem List   Diagnosis Date Noted  . Acute cystitis with hematuria 03/31/2020  . Nonintractable episodic headache 03/31/2020  . Calculus of gallbladder without cholecystitis without obstruction   . Screening examination for STD (sexually transmitted disease) 04/18/2019  . Nexplanon in place 04/18/2019  . Periumbilical abdominal pain 04/18/2019  . Nexplanon insertion 07/25/2018  . Irregular menstrual bleeding 02/10/2016  . Learning disability 08/22/2015  . ADD (attention deficit disorder) 08/22/2015  . 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 DUB (dysfunctional uterine bleeding) 12/03/2013  . Anemia 12/03/2013  . Scoliosis 04/28/2013  . Adiposity 10/19/2012  . 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 CHOLECYSTECTOMY N/A 02/13/2020   Procedure: LAPAROSCOPIC  CHOLECYSTECTOMY;  Surgeon: Franky Macho, MD;  Location: [facility];  Service: General;  Laterality: N/A;  . UPPER ENDOSCOPY W/ SCLEROTHERAPY       OB History    Gravida  0   Para  0   Term  0   Preterm  0   AB  0   Living  0     SAB  0   TAB  0   Ectopic  0   Multiple  0   Live Births              Family History  Problem Relation Age of Onset  . Arthritis Mother   . Arthritis Father   . Arthritis Maternal Grandmother   . Diabetes Maternal Grandfather   . Arthritis Maternal Grandfather   . Arthritis Paternal Grandfather     Social History   Tobacco Use  . Smoking status: Current Every Day Smoker    Packs/day: 1.00    Years: 3.00    Pack years: 3.00    Types: E-cigarettes, Cigarettes  . Smokeless tobacco: Never Used  Substance Use Topics  . Alcohol use: No    Alcohol/week: 0.0 standard drinks  . Drug use: No    Home Medications Prior to Admission medications   Medication Sig Start Date End Date Taking? Authorizing Provider  acetaminophen (TYLENOL) 500 MG tablet Take 500 mg by mouth every 6 (six) hours as needed for mild pain or fever.   Yes [provider]  etonogestrel (NEXPLANON) 68 MG IMPL implant 1 each by Subdermal route once.   Yes [provider]  ciprofloxacin (CIPRO) 250 MG  tablet Take 1 tablet (250 mg total) by mouth 2 (two) times daily. Patient not taking: Reported on 03/31/2020 03/31/20   Elvia Collum M, DO  ondansetron (ZOFRAN ODT) 4 MG disintegrating tablet Take 1 tablet (4 mg total) by mouth every 8 (eight) hours as needed for nausea or vomiting. Patient not taking: Reported on 03/31/2020 03/05/20   Mikey Kirschner, MD    Allergies    Estrogens and Hydrocodone  Review of Systems   Review of Systems  Constitutional: Negative for appetite change and fatigue.  HENT: Negative for congestion, ear discharge and sinus pressure.   Eyes: Negative for discharge.  Respiratory: Negative for cough.   Cardiovascular: Negative  for chest pain.  Gastrointestinal: Positive for abdominal pain. Negative for diarrhea.  Genitourinary: Negative for frequency and hematuria.  Musculoskeletal: Negative for back pain.  Skin: Negative for rash.  Neurological: Positive for weakness. Negative for seizures and headaches.  Psychiatric/Behavioral: Negative for hallucinations.    Physical Exam Updated Vital Signs BP 102/66   Pulse (!) 103   Temp 98.3 F (36.8 C) (Oral)   Resp 16   Ht 5\' 3"  (1.6 m)   Wt 88 kg   SpO2 97%   BMI 34.37 kg/m   Physical Exam Vitals and nursing note reviewed.  Constitutional:      Appearance: She is well-developed.  HENT:     Head: Normocephalic.     Nose: Nose normal.  Eyes:     General: No scleral icterus.    Conjunctiva/sclera: Conjunctivae normal.  Neck:     Thyroid: No thyromegaly.  Cardiovascular:     Rate and Rhythm: Normal rate and regular rhythm.     Heart sounds: No murmur. No friction rub. No gallop.   Pulmonary:     Breath sounds: No stridor. No wheezing or rales.  Chest:     Chest wall: No tenderness.  Abdominal:     General: There is no distension.     Tenderness: There is abdominal tenderness. There is no rebound.     Comments: Mild suprapubic tenderness  Musculoskeletal:        General: Normal range of motion.     Cervical back: Neck supple.  Lymphadenopathy:     Cervical: No cervical adenopathy.  Skin:    Findings: No erythema or rash.  Neurological:     Mental Status: She is alert and oriented to person, place, and time.     Motor: No abnormal muscle tone.     Coordination: Coordination normal.  Psychiatric:        Behavior: Behavior normal.     ED Results / Procedures / Treatments   Labs (all labs ordered are listed, but only abnormal results are displayed) Labs Reviewed  HCG, QUANTITATIVE, PREGNANCY  CBC  BASIC METABOLIC PANEL  URINALYSIS, ROUTINE W REFLEX MICROSCOPIC    EKG None  Radiology No results found.  Procedures Procedures  (including critical care time)  Medications Ordered in ED Medications - No data to display  ED Course  I have reviewed the triage vital signs and the nursing notes.  Pertinent labs & imaging results that were available during my care of the patient were reviewed by me and considered in my medical decision making (see chart for details).    MDM Rules/Calculators/A&P                       This patient presents to the ED for concern of hematuria, this involves an extensive  number of treatment options, and is a complaint that carries with it a high risk of complications and morbidity.  The differential diagnosis includes UTI. renal tumor   Lab Tests:   I Ordered, reviewed, and interpreted labs, which included CBC chemistries and pregnancy test.  Patient had urinalysis ordered but she did not give [place] a urine specimen  Medicines ordered:     Imaging Studies ordered:   Additional history obtained:   Additional history obtained from old chart  Previous records obtained and reviewed   Consultations Obtained:  Reevaluation:  Patient left AMA without giving [place] a urine specimen.  Critical Interventions:  .   Final Clinical Impression(s) / ED Diagnoses Final diagnoses:  Hematuria, unspecified type    Rx / DC Orders ED Discharge Orders    None       Bethann Berkshire, MD 03/31/20 1759

## 2020-03-31 NOTE — Progress Notes (Signed)
Subjective:    Patient ID: Joanna Higgins, female    DOB: 07/23/2001, 19 y.o.   MRN: [id number]   Virtual Visit via phone Note  I connected with [name] on 03/31/20 at  1:10 PM EDT by a phone call  and verified that I am speaking with the correct person using two identifiers.  Location: Patient: home Provider: office   I discussed the limitations of evaluation and management by telephone and the availability of in person appointments. The patient expressed understanding and agreed to proceed.  Dysuria  This is a new problem. The current episode started more than 1 month ago. Associated symptoms include frequency, hematuria and urgency. Pertinent negatives include no chills, flank pain, nausea or vomiting.   Patient states urinary issues have been going on since having gallbladder out in Feb.  She thinks they used a foley catheter while she was in the hospital. Has been noticing blood from urination since the surgery.    It's not painful to urinate.  No burning.  Some frequency.  No fever, or lower abd pain.  She does admit to history of upper abd epigastric pain in past.  Since then has had her gallbladder removed.   Patient also states she is having problems with her migraine headaches which cause her to be dizzy and have stomach issues.  Patient also having cough, congestion for several days - patient thinks it could be allergies.  Advising pt that she has a lot of symptoms that she would probably need to be seen at urgent care or ER if things worsen.  Gave otc remedies for some of her concerns.   Headaches using tylenol prn. Has not been drinking much fluids/water.  Not having heavy bleeding. Using tampon and not having leaking.  Pt stating only having some blood with wiping when urinating.  Upon review of chart, has had nexplanon since 4/20 and h/o irregular menstrual bleeding since 2/14.    Review of Systems  Constitutional: Negative for chills and fever.   Gastrointestinal: Negative for blood in stool, constipation, diarrhea, nausea and vomiting.  Genitourinary: Positive for frequency, hematuria, urgency and vaginal bleeding (pt spotting since having nexplanon). Negative for dysuria, flank pain, menstrual problem, pelvic pain, vaginal discharge and vaginal pain.  Musculoskeletal: Negative for arthralgias and back pain.  Neurological: Positive for dizziness and headaches. Negative for seizures, weakness, light-headedness and numbness.        Objective:   Physical Exam   -phone visit- unable to complete PE.     Assessment & Plan:   1. Acute cystitis with hematuria  - ciprofloxacin (CIPRO) 250 MG tablet; Take 1 tablet (250 mg total) by mouth 2 (two) times daily.  Dispense: 6 tablet; Refill: 0  2. Nonintractable episodic headache, unspecified headache type  For her headaches using tylenol and increase fluids. For cystitis with hematuria to take cipro for 3 days.  Increase fluid intake.  If having more bleeding or going through 1 pad/tampon per hour needs emergent evaluation. Pt to continue to monitor the bleeding, to call or rto if worsening. Advising to try cipro 250mg  bid for 3 days.  Pt to f/u with gyn as scheduled on 04/10/20.   Pt agreed with plan and voiced understanding.   Follow Up Instructions:    I discussed the assessment and treatment plan with the patient. The patient was provided an opportunity to ask questions and all were answered. The patient agreed with the plan and demonstrated an understanding of the  instructions.   The patient was advised to call back or seek an in-person evaluation if the symptoms worsen or if the condition fails to improve as anticipated.  I provided 18 minutes of non-face-to-face time during this encounter.

## 2020-04-01 ENCOUNTER — Telehealth: Payer: Self-pay | Admitting: Family Medicine

## 2020-04-01 ENCOUNTER — Encounter: Payer: Self-pay | Admitting: Family Medicine

## 2020-04-01 ENCOUNTER — Telehealth: Payer: Self-pay | Admitting: Advanced Practice Midwife

## 2020-04-01 ENCOUNTER — Ambulatory Visit (INDEPENDENT_AMBULATORY_CARE_PROVIDER_SITE_OTHER): Payer: Medicaid Other | Admitting: Family Medicine

## 2020-04-01 ENCOUNTER — Telehealth: Payer: Self-pay | Admitting: *Deleted

## 2020-04-01 VITALS — BP 116/70 | Temp 98.5°F | Wt 195.4 lb

## 2020-04-01 DIAGNOSIS — R55 Syncope and collapse: Secondary | ICD-10-CM

## 2020-04-01 DIAGNOSIS — R109 Unspecified abdominal pain: Secondary | ICD-10-CM | POA: Diagnosis not present

## 2020-04-01 DIAGNOSIS — E861 Hypovolemia: Secondary | ICD-10-CM

## 2020-04-01 DIAGNOSIS — I9589 Other hypotension: Secondary | ICD-10-CM

## 2020-04-01 LAB — POCT URINALYSIS DIPSTICK
Spec Grav, UA: >=1.03 — AB (ref 1.010–1.025)
Urobilinogen, UA: 0.2 E.U./dL
pH, UA: 5 (ref 5.0–8.0)

## 2020-04-01 MED ORDER — DICYCLOMINE HCL 10 MG PO CAPS: 10.0000 mg | ORAL_CAPSULE | Freq: Three times a day (TID) | ORAL | 1 refills | Status: DC

## 2020-04-01 MED ORDER — PANTOPRAZOLE SODIUM 40 MG PO TBEC: 40.0000 mg | DELAYED_RELEASE_TABLET | Freq: Every day | ORAL | 3 refills | Status: DC

## 2020-04-01 MED ORDER — ONDANSETRON HCL 8 MG PO TABS: 8.0000 mg | ORAL_TABLET | Freq: Three times a day (TID) | ORAL | 1 refills | Status: DC | PRN

## 2020-04-01 NOTE — Telephone Encounter (Signed)
Patient had a virtual visit with Dr. Ladona Ridgel yesterday for urinary symptoms and congestion and cough and dizzy.  Went to ER last night with same symptoms but left AMA.  Now calling and wanting to see Dr. Lorin Picket today for the same symptoms plus now has nausea and vomiting.  Hasn't taken meds Dr. Ladona Ridgel prescribed yesterday. Hasn't had covid testing.

## 2020-04-01 NOTE — Telephone Encounter (Signed)
Patient called stating that she would like to change her BC to another pill, please contact pt

## 2020-04-01 NOTE — Patient Instructions (Addendum)
You will need to consume more liquids and food on a daily basis  I have sent in pantoprazole for you to take 1 daily to help with stomach acid It is advisable for you to do your blood work We are setting you up with a gastroenterologist for further evaluation  I would recommend drinking Gatorade, juices, clear sodas, or water daily in order to keep yourself well-hydrated.  You should drink enough liquids to where when you urinate that it is a light yellow color   For now we will stick with bland foods such as peanut butter crackers, applesauce, yogurt's, soft fruits soft vegetables, deli [place]  Avoid fried foods and fatty foods such as [place] fries hamburgers pizza  We will also bring you back here for recheck in 2  Weeks  It is important for you to be careful with standing and with walking.  If you feel lightheaded or woozy you immediately need to sit then lay down for several minutes.  Should you pass out again I would highly recommend going to the day emergency department you may need IV fluids

## 2020-04-01 NOTE — Telephone Encounter (Signed)
Telephoned patient at home number. Patient is wanting to change birth control due to bleeding. Advised patient to keep scheduled pap/phyiscal appointment and discuss at that visit. Patient voiced understanding.

## 2020-04-01 NOTE — Telephone Encounter (Signed)
Called pt and scheduled office visit today per dr Lorin Picket.

## 2020-04-01 NOTE — Progress Notes (Signed)
Subjective:    Patient ID: Joanna Higgins, female    DOB: 02-Apr-2001, 19 y.o.   MRN: [id number]  Abdominal Pain This is a new problem. Episode onset: 2 months. The pain is located in the generalized abdominal region, LUQ and RUQ. The quality of the pain is cramping. The abdominal pain radiates to the right flank. Associated symptoms include diarrhea and nausea. Associated symptoms comments: Dizziness, nausea. Pt states she passed out last night and yesterday morning. Pt has feelings of passing out often. No appetite, unable to sleep, can tolerate liquids..  Patient relates intermittent abdominal pains over the past 2 months. Patient did have her gallbladder removed. Patient states occasional loose stools since then Denies any blood in bowel movements Was seen yesterday for probable cystitis placed on Cipro she was concerned it would make her abdomen hurt and did not get it filled   Pt believes her issues are coming from the Nexplanon. Pt has had Nexplanon for about 2 months.     Review of Systems  Constitutional: Negative for activity change, appetite change and fatigue.  HENT: Negative for congestion and rhinorrhea.   Respiratory: Negative for cough and shortness of breath.   Cardiovascular: Negative for chest pain and leg swelling.  Gastrointestinal: Positive for abdominal pain, diarrhea and nausea.  Endocrine: Negative for polydipsia and polyphagia.  Skin: Negative for color change.  Neurological: Negative for dizziness and weakness.  Psychiatric/Behavioral: Negative for behavioral problems and confusion.       Objective:   Physical Exam Vitals reviewed.  Constitutional:      General: She is not in acute distress. HENT:     Head: Normocephalic and atraumatic.  Eyes:     General:        Right eye: No discharge.        Left eye: No discharge.  Neck:     Trachea: No tracheal deviation.  Cardiovascular:     Rate and Rhythm: Normal rate and regular rhythm.     Heart  sounds: Normal heart sounds. No murmur.  Pulmonary:     Effort: Pulmonary effort is normal. No respiratory distress.     Breath sounds: Normal breath sounds.  Lymphadenopathy:     Cervical: No cervical adenopathy.  Skin:    General: Skin is warm and dry.  Neurological:     Mental Status: She is alert.     Coordination: Coordination normal.  Psychiatric:        Behavior: Behavior normal.    On abdominal exam she does have some mild epigastric tenderness no guarding or rebound   30 minutes spent with the patient greater than half were discussing multiple different issues reviewing her ER records labs plus also documentation    Assessment & Plan:  1. Abdominal pain, unspecified abdominal location The patient would benefit getting back in with gastroenterology.  She may well have to have an EGD.  Hold off on any additional scans labs x-rays currently - POCT Urinalysis Dipstick - Urine Culture - Ambulatory referral to Gastroenterology  2. Hypotension due to hypovolemia Patient has not been eating or drinking because she is fearful of having stomach pains I encourage her to stick with a bland diet low residue clear liquids and drink something more than just water warning signs regarding passing out were discussed go to ER if dramatically worse  3. Syncope, unspecified syncope type Passing out spell I believe is due to hypovolemia I do not feel she needs a in-depth cardiac work-up she  needs to do a better job getting fluids and

## 2020-04-02 ENCOUNTER — Encounter: Payer: Self-pay | Admitting: Family Medicine

## 2020-04-03 ENCOUNTER — Other Ambulatory Visit: Payer: Self-pay | Admitting: *Deleted

## 2020-04-03 LAB — URINE CULTURE

## 2020-04-03 MED ORDER — CEPHALEXIN 500 MG PO CAPS: 500.0000 mg | ORAL_CAPSULE | Freq: Three times a day (TID) | ORAL | 0 refills | Status: AC

## 2020-04-08 IMAGING — CT CT RENAL STONE PROTOCOL
2 of 4 series · 16 of 46 positions shown, 18 images · non-contrast
Comparison: None.

CLINICAL DATA: Right flank and lower quadrant pain with nausea and
vomiting

EXAM:
CT ABDOMEN AND PELVIS WITHOUT CONTRAST
TECHNIQUE: Multidetector CT imaging of the abdomen and pelvis was performed
following the standard protocol without oral or IV contrast.

[Series 2: stone full standard · axial · 0.81mm/px · z∈[-1049,-609]mm · 13 of 96 slices shown, 15 images]
[im 4/96  soft-tissue]
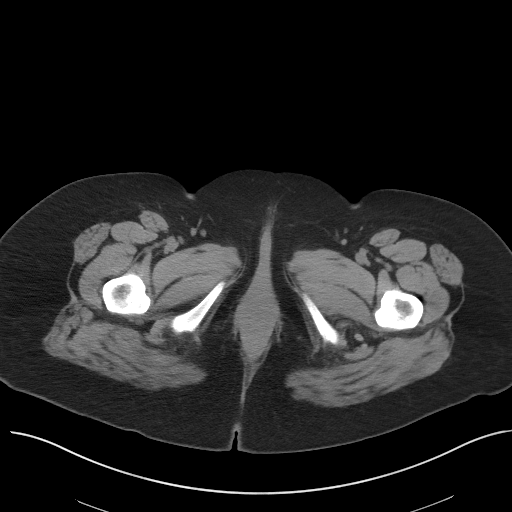
[im 4/96  bone]
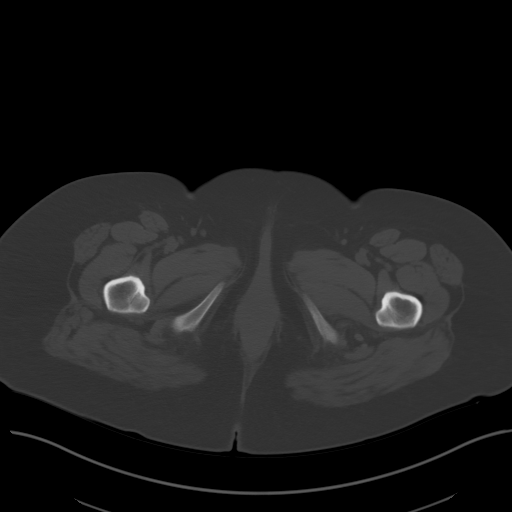
[im 12/96  soft-tissue]
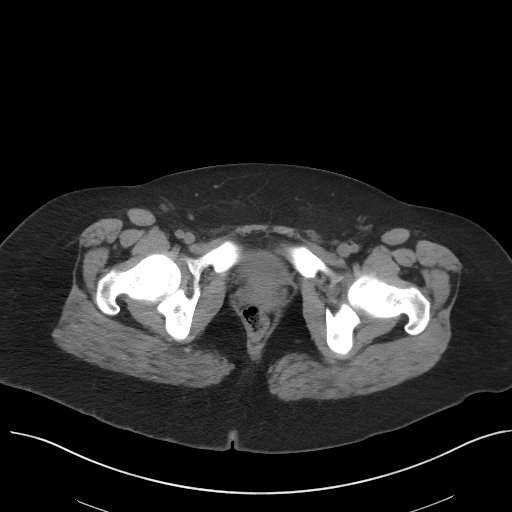
[im 20/96  soft-tissue]
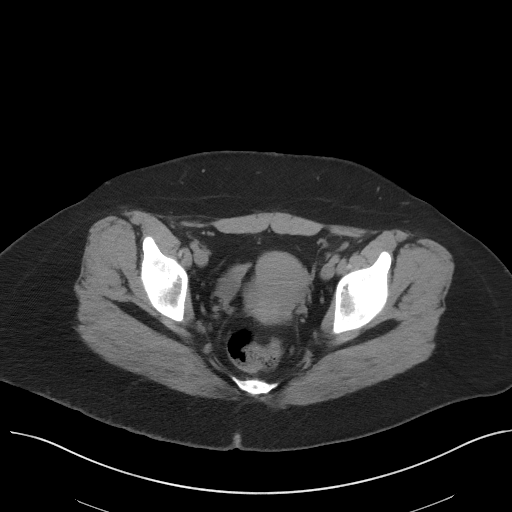
[im 28/96  soft-tissue]
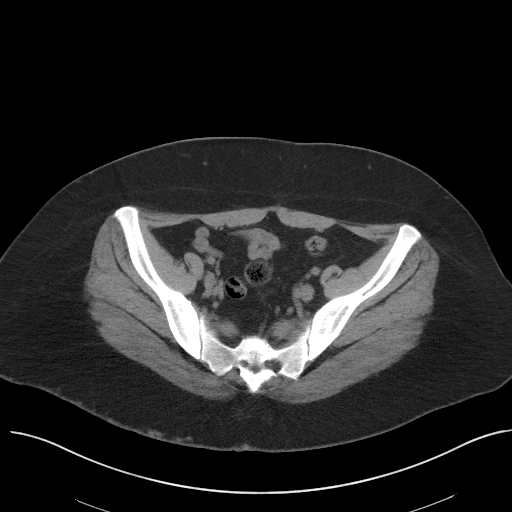
[im 32/96  soft-tissue]
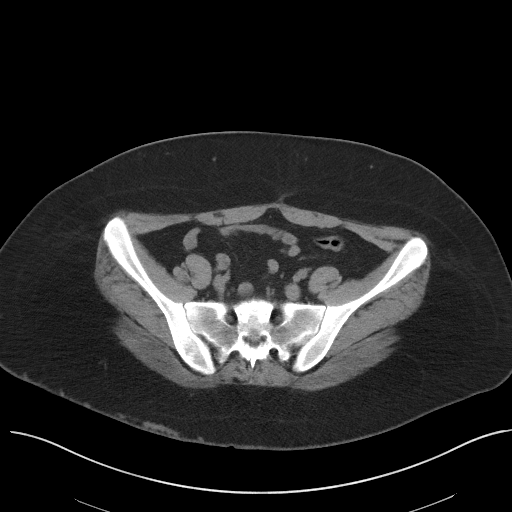
[im 40/96  soft-tissue]
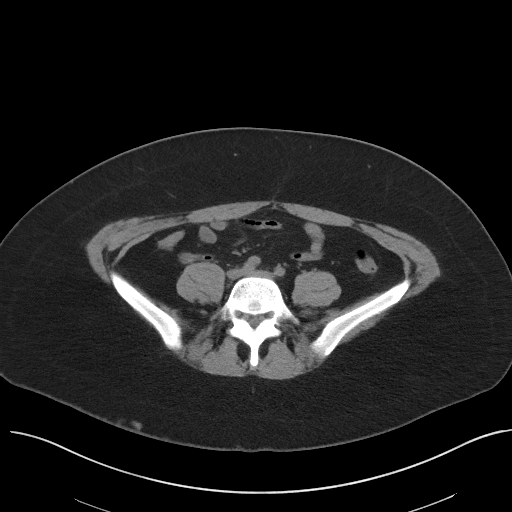
[im 48/96  soft-tissue]
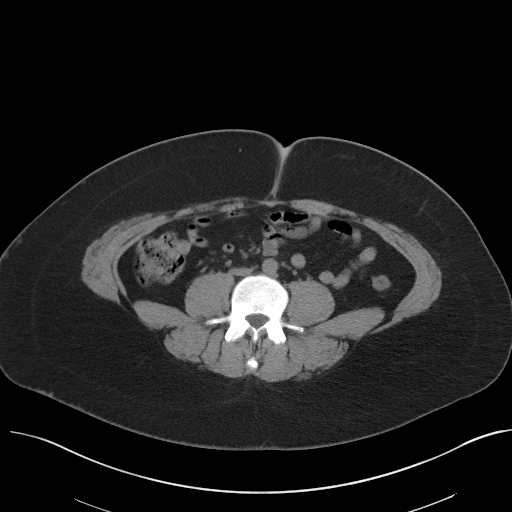
[im 56/96  soft-tissue]
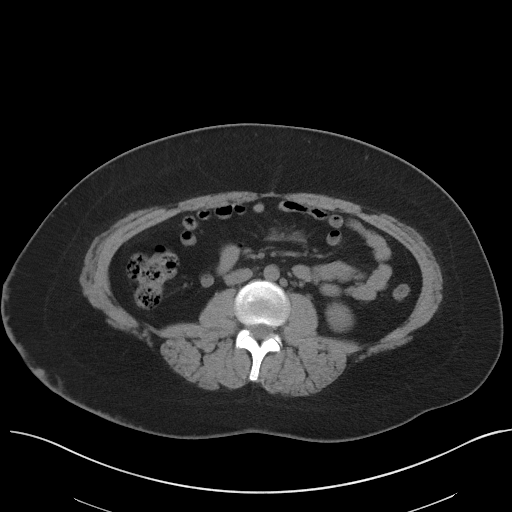
[im 64/96  soft-tissue]
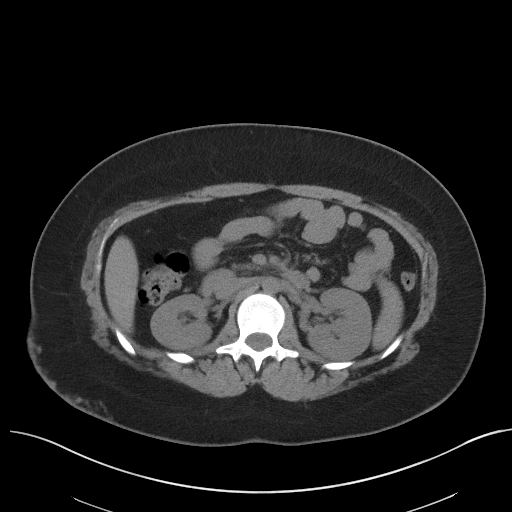
[im 64/96  bone]
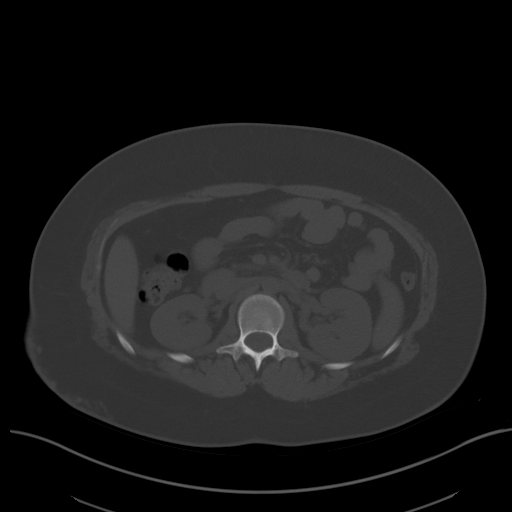
[im 68/96  soft-tissue]
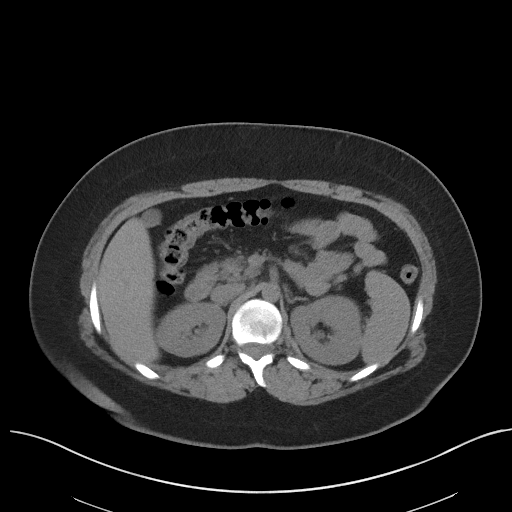
[im 76/96  soft-tissue]
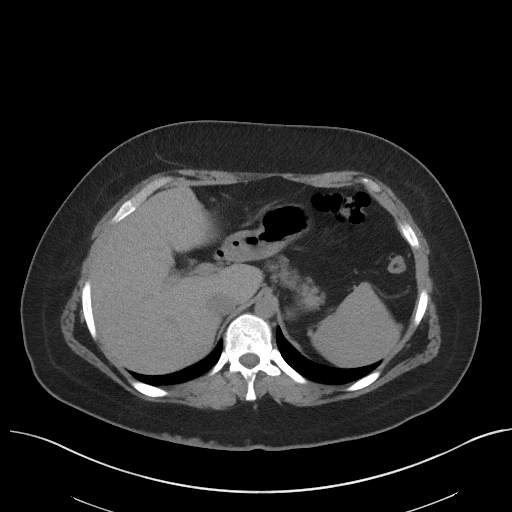
[im 84/96  soft-tissue]
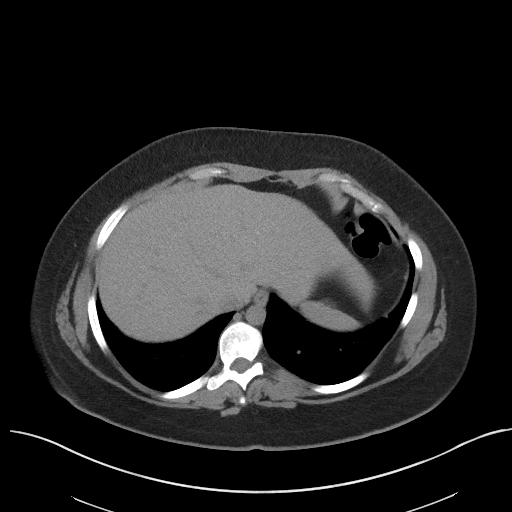
[im 92/96  soft-tissue]
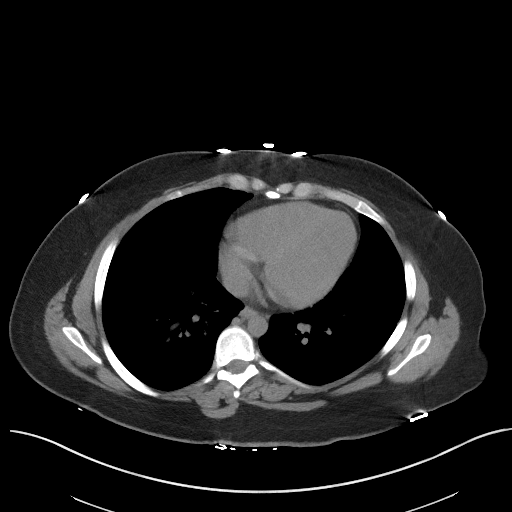

[Series 5: coronal · coronal · 0.80mm/px · 3 of 130 slices shown]
[im 44/130  soft-tissue]
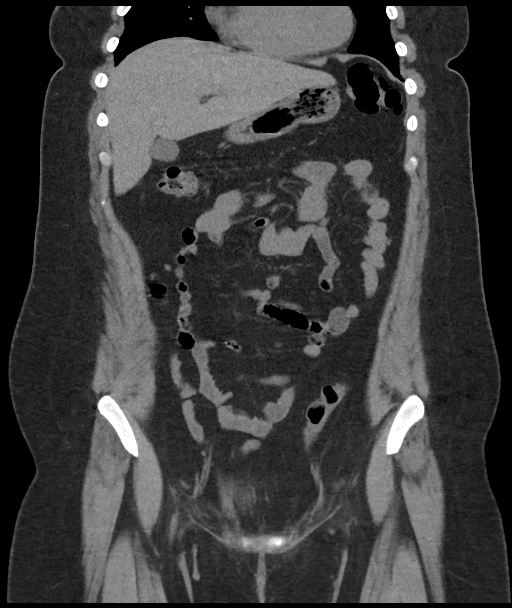
[im 58/130  soft-tissue]
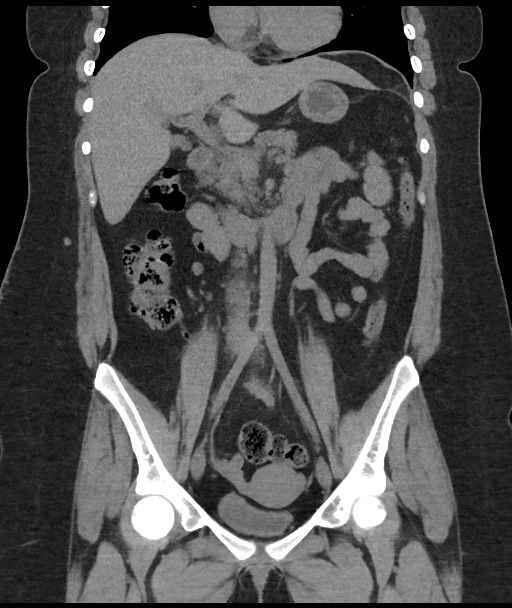
[im 72/130  soft-tissue]
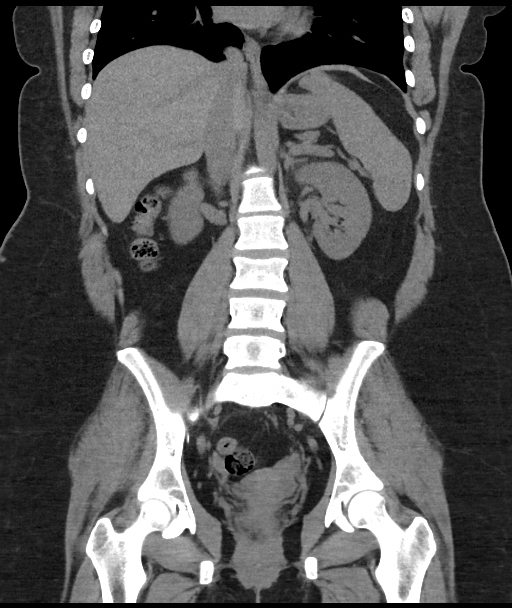

[16 of 46 positions shown; findings below may reference images not displayed]

FINDINGS: Lower chest: There is patchy infiltrate in each lung base.

Hepatobiliary: No focal liver lesions are apparent on this
noncontrast enhanced study. Gallbladder wall is not appreciably
thickened. There is no biliary duct dilatation.

Pancreas: No pancreatic mass or inflammatory focus evident.

Spleen: No splenic lesions are appreciable.

Adrenals/Urinary Tract: Adrenals bilaterally appear normal. Kidneys
bilaterally show no evident mass or hydronephrosis on either side.
There is no evident renal or ureteral calculus on either side.
Urinary bladder is midline with wall thickness within normal limits
for degree of distention.

Stomach/Bowel: There is no appreciable bowel wall or mesenteric
thickening. There is no evident bowel obstruction. There is no free
air or portal venous air.

Vascular/Lymphatic: No abdominal aortic aneurysm. No vascular lesion
evident on this noncontrast enhanced study. There is no evident
adenopathy in the abdomen or pelvis.

Reproductive: The uterus is anteverted.  No pelvic masses evident.

Other: Appendix appears normal. There is no abscess or ascites in
the abdomen or pelvis.

Musculoskeletal: There is thoracolumbar levoscoliosis, mild. There
are no blastic or lytic bone lesions. There is no intramuscular or
abdominal wall lesion evident.
IMPRESSION: 1. Areas of patchy infiltrate, felt to represent pneumonia, in the
lung bases bilaterally.

2. No evident renal or ureteral calculus. No hydronephrosis on
either side.

3. No evident bowel obstruction. No abscess in the abdomen or
pelvis. Appendix appears normal.

## 2020-04-09 ENCOUNTER — Encounter: Payer: Self-pay | Admitting: Internal Medicine

## 2020-04-10 ENCOUNTER — Other Ambulatory Visit: Payer: Self-pay

## 2020-04-10 ENCOUNTER — Ambulatory Visit (INDEPENDENT_AMBULATORY_CARE_PROVIDER_SITE_OTHER): Payer: Medicaid Other | Admitting: Advanced Practice Midwife

## 2020-04-10 ENCOUNTER — Encounter: Payer: Self-pay | Admitting: Advanced Practice Midwife

## 2020-04-10 ENCOUNTER — Other Ambulatory Visit (HOSPITAL_COMMUNITY)
Admission: RE | Admit: 2020-04-10 | Discharge: 2020-04-10 | Disposition: A | Discharge status: Home / Self Care | Payer: Medicaid Other | Source: Ambulatory Visit | Attending: Advanced Practice Midwife | Admitting: Advanced Practice Midwife

## 2020-04-10 VITALS — BP 103/71 | HR 96 | Ht 63.0 in | Wt 196.8 lb

## 2020-04-10 DIAGNOSIS — Z0000 Encounter for general adult medical examination without abnormal findings: Secondary | ICD-10-CM | POA: Diagnosis not present

## 2020-04-10 DIAGNOSIS — Z01419 Encounter for gynecological examination (general) (routine) without abnormal findings: Secondary | ICD-10-CM | POA: Insufficient documentation

## 2020-04-10 NOTE — Progress Notes (Signed)
Joanna Higgins 19 y.o.  Vitals:   04/10/20 1547  BP: 103/71  Pulse: 96     Filed Weights   04/10/20 1547  Weight: 196 lb 12.8 oz (89.3 kg)    Past Medical History: Past Medical History:  Diagnosis Date  . Bladder instability   . Bulging lumbar disc   . DIC (disseminated intravascular coagulation) ([place])   . DVT (deep venous thrombosis) ([place])    2016  . Irregular menstrual bleeding 02/10/2016  . Scoliosis   . Vascular malformation     Past Surgical History: Past Surgical History:  Procedure Laterality Date  . CHOLECYSTECTOMY N/A 02/13/2020   Procedure: LAPAROSCOPIC CHOLECYSTECTOMY;  Surgeon: Aviva Signs, MD;  Location: [facility];  Service: General;  Laterality: N/A;  . UPPER ENDOSCOPY W/ SCLEROTHERAPY      Family History: Family History  Problem Relation Age of Onset  . Arthritis Mother   . Arthritis Father   . Arthritis Maternal Grandmother   . Diabetes Maternal Grandfather   . Arthritis Maternal Grandfather   . Arthritis Paternal Grandfather     Social History: Social History   Tobacco Use  . Smoking status: Current Every Day Smoker    Packs/day: 1.00    Years: 3.00    Pack years: 3.00    Types: E-cigarettes, Cigarettes  . Smokeless tobacco: Never Used  Substance Use Topics  . Alcohol use: No    Alcohol/week: 0.0 standard drinks  . Drug use: No    Allergies:  Allergies  Allergen Reactions  . 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 Hydrocodone Nausea And Vomiting      Current Outpatient Medications:  .  etonogestrel (NEXPLANON) 68 MG IMPL implant, 1 each by Subdermal route once., Disp: , Rfl:  .  acetaminophen (TYLENOL) 500 MG tablet, Take 500 mg by mouth every 6 (six) hours as needed for mild pain or fever., Disp: , Rfl:  .  ciprofloxacin (CIPRO) 250 MG tablet, Take 1 tablet (250 mg total) by mouth 2 (two) times daily. (Patient not taking: Reported on 03/31/2020), Disp: 6 tablet, Rfl:  0 .  dicyclomine (BENTYL) 10 MG capsule, Take 1 capsule (10 mg total) by mouth 3 (three) times daily before meals. (Patient not taking: Reported on 04/10/2020), Disp: 90 capsule, Rfl: 1 .  ondansetron (ZOFRAN) 8 MG tablet, Take 1 tablet (8 mg total) by mouth every 8 (eight) hours as needed for nausea. (Patient not taking: Reported on 04/10/2020), Disp: 12 tablet, Rfl: 1 .  pantoprazole (PROTONIX) 40 MG tablet, Take 1 tablet (40 mg total) by mouth daily. (Patient not taking: Reported on 04/10/2020), Disp: 30 tablet, Rfl: 3  History of Present Illness: Here for first pap. Has had Nexplanon since 2019 (usually tells providers she's had it for "2 months").  Has been seeing PCP about lower abdominal cramping/pain. Thought it was the IUD in 2019, so had it removed.  Still continued to cramp/pain, couldn't remember to take birth control pills, so got Nexplanon 2 months later. Having trouble eating, states she has "lost lots of weight" (chart check reveals same weight since May 2019).  Has referral to GI on 4/30, encouraged to keep appt.   Review of Systems   Patient denies any headaches, blurred vision, shortness of breath, chest pain, abdominal pain, problems with bowel movements, urination, or intercourse.   Physical Exam: General:  Well developed, well nourished, no acute distress Skin:  Warm and dry  Neck:  Midline trachea, normal thyroid Lungs; Clear to auscultation bilaterally Breast:  No dominant palpable mass, retraction, or nipple discharge Cardiovascular: Regular rate and rhythm Abdomen:  Soft, non tender, no hepatosplenomegaly Pelvic:  External genitalia is normal in appearance.  The vagina is normal in appearance.  The cervix is nulliparous. Uterus is felt to be normal size, shape, and contour.  No adnexal masses or tenderness noted.  Extremities:  No swelling or varicosities noted   Impression: Normal GYN exam  Chronic abdominal pain (c/o it for >2 years):  GI referral made by  PCP   Plan: If pap normal, repeat q 3 years Seems to be on the right track w/GI referral for chronic abd pain exacerbated by eating.

## 2020-04-14 LAB — CYTOLOGY - PAP
Chlamydia: NEGATIVE
Comment: NEGATIVE
Comment: NEGATIVE
Comment: NORMAL
Diagnosis: NEGATIVE
High risk HPV: NEGATIVE
Neisseria Gonorrhea: NEGATIVE

## 2020-04-15 ENCOUNTER — Other Ambulatory Visit: Payer: Self-pay

## 2020-04-15 ENCOUNTER — Ambulatory Visit (INDEPENDENT_AMBULATORY_CARE_PROVIDER_SITE_OTHER): Payer: Medicaid Other | Admitting: Family Medicine

## 2020-04-15 ENCOUNTER — Encounter: Payer: Self-pay | Admitting: Family Medicine

## 2020-04-15 VITALS — BP 112/68 | Temp 97.6°F | Ht 63.0 in | Wt 198.0 lb

## 2020-04-15 DIAGNOSIS — R109 Unspecified abdominal pain: Secondary | ICD-10-CM

## 2020-04-15 NOTE — Progress Notes (Signed)
   Subjective:    Patient ID: Joanna Higgins, female    DOB: 30-Sep-2001, 19 y.o.   MRN: [id number]  HPI  Patient arrives for a follow up on abdominal pain. Patient states she is feeling a little better. Patient was having severe abdominal pain on her last visit  Less pain less discomfort.  But still having some pain Doing somewhat better than she was last time She is not drinking much or eating much because of fear of having abdominal pain. Yet her weight is 3 pounds heavier than last visit Review of Systems  Constitutional: Negative for activity change, appetite change and fatigue.  HENT: Negative for congestion.   Respiratory: Negative for cough.   Cardiovascular: Negative for chest pain.  Gastrointestinal: Positive for abdominal pain and nausea.  Skin: Negative for color change.  Neurological: Negative for headaches.  Psychiatric/Behavioral: Negative for behavioral problems.       Objective:   Physical Exam  Lungs clear respiratory rate normal heart regular no murmurs abdomen soft no guarding rebound or tenderness      Assessment & Plan:  Ongoing intermittent abdominal pain Has appointment with GI coming up in a few weeks Reinforced the need to use the PPI Bland diet multiple times per day Plenty of liquids daily Follow-up if progressive troubles

## 2020-04-24 NOTE — Progress Notes (Signed)
Referring Provider: Babs Sciara, MD Primary Care Physician:  Babs Sciara, MD Primary Gastroenterologist:  Dr. Jena Gauss  Chief Complaint  Patient presents with  . Abdominal Pain    LUQ, RUQ, constant pain x 2 months. No nausea, no vomiting, no constipation, no diarrhea    HPI:   Joanna Higgins is a 19 y.o. female presenting today at the request of Luking, Jonna Coup, MD for abdominal pain.   Laparoscopic cholecystectomy 02/13/2020.  Pathology revealed chronic cholecystitis with cholelithiasis.  Office visit with PCP 04/01/2020 with 2 months of LUQ and RUQ cramping abdominal pain radiating to the right flank.  Associated nausea.  Also with occasional loose stools since cholecystectomy.  Wondered if symptoms were coming from Nexplanon as she had this for about 2 months.  Also reported 2 episodes of syncope and symptoms of dizziness.  Suspected syncope was due to hypovolemia. Urine culture completed with moderate amount of bacteria and mixed colonies.  She was prescribed Keflex 500 mg 3 times daily x5 days.  She was prescribed Protonix daily and dicyclomine 3 times daily. Also referred to GI for abdominal pain.  Follow-up with PCP 04/15/2020: Continue with mild abdominal pain but overall improved.  Still not drinking or eating much due to fear of having abdominal pain.  Today: LUQ and RUQ pain x 2 months. Constant. About 6/10. Worse at night. Hard for her to sleep. Radiates to her right side. Sharp pain on right side. Symptoms started after having her gallbladder removed. Pain worsens with meals. Greasy foods are worse. Feels like she has lost weight. Reports losing about 4 lbs. When she looks at food it makes her sick. No GERD symptoms. No dysphagia.    No N/V. No fever or chills. No constipation or diarrhea. BMs every 2-3 days. Having to strain. Stools are hard. Taking a clear liquid to help with hard stools. Also started stool softeners. Just using as needed for a couple of weeks. States she  got dehydrated. Trouble with hard stools is new. No blood in the stool. No black stool.   Now drinking plenty of water. Urine is pale yellow to clear. No hematurina or dysuria.   Taking ibuprofen at night a few days a week. Just 1 pill. X 2 months-for abdominal pain.   Didn't start bentyl. She is taking pantoprazole daily but has not noticed this helping her symptoms.  Past Medical History:  Diagnosis Date  . Bladder instability   . Bulging lumbar disc   . DIC (disseminated intravascular coagulation) ([facility])   . DVT (deep venous thrombosis) ([facility])    2016  . Irregular menstrual bleeding 02/10/2016  . Scoliosis   . Vascular malformation     Past Surgical History:  Procedure Laterality Date  . CHOLECYSTECTOMY N/A 02/13/2020   Procedure: LAPAROSCOPIC CHOLECYSTECTOMY;  Surgeon: Franky Macho, MD;  Location: [facility];  Service: General;  Laterality: N/A;  . UPPER ENDOSCOPY W/ SCLEROTHERAPY     ?patient denies    Current Outpatient Medications  Medication Sig Dispense Refill  . acetaminophen (TYLENOL) 500 MG tablet Take 500 mg by mouth every 6 (six) hours as needed for mild pain or fever.    . etonogestrel (NEXPLANON) 68 MG IMPL implant 1 each by Subdermal route once.    . pantoprazole (PROTONIX) 40 MG tablet Take 1 tablet (40 mg total) by mouth daily. 30 tablet 3  . ondansetron (ZOFRAN) 8 MG tablet Take 1 tablet (8 mg total) by mouth every 8 (eight) hours as  needed for nausea. (Patient not taking: Reported on 04/10/2020) 12 tablet 1   No current facility-administered medications for this visit.    Allergies as of 04/25/2020 - Review Complete 04/25/2020  Allergen Reaction Noted  . Estrogens Other (See Comments) 04/27/2015  . Hydrocodone Nausea And Vomiting 11/24/2014    Family History  Problem Relation Age of Onset  . Arthritis Mother   . Arthritis Father   . Arthritis Maternal Grandmother   . Diabetes Maternal Grandfather   . Arthritis Maternal Grandfather   . Arthritis Paternal  Grandfather   . Colon cancer Neg Hx   . Pancreatic cancer Neg Hx     Social History   Socioeconomic History  . Marital status: Single    Spouse name: Not on file  . Number of children: 0  . Years of education: Not on file  . Highest education level: Not on file  Occupational History  . Not on file  Tobacco Use  . Smoking status: Current Every Day Smoker    Packs/day: 1.00    Years: 3.00    Pack years: 3.00    Types: E-cigarettes, Cigarettes  . Smokeless tobacco: Never Used  Substance and Sexual Activity  . Alcohol use: No    Alcohol/week: 0.0 standard drinks  . Drug use: No  . Sexual activity: Yes    Birth control/protection: Implant  Other Topics Concern  . Not on file  Social History Narrative  . Not on file   Social Determinants of Health   Financial Resource Strain: Low Risk   . Difficulty of Paying Living Expenses: Not hard at all  Food Insecurity: Food Insecurity Present  . Worried About Programme researcher, broadcasting/film/video in the Last Year: Sometimes true  . Ran Out of Food in the Last Year: Sometimes true  Transportation Needs: No Transportation Needs  . Lack of Transportation (Medical): No  . Lack of Transportation (Non-Medical): No  Physical Activity: Inactive  . Days of Exercise per Week: 0 days  . Minutes of Exercise per Session: 0 min  Stress: No Stress Concern Present  . Feeling of Stress : Not at all  Social Connections: Unknown  . Frequency of Communication with Friends and Family: Never  . Frequency of Social Gatherings with Friends and Family: Never  . Attends Religious Services: Never  . Active Member of Clubs or Organizations: No  . Attends Banker Meetings: Never  . Marital Status: Patient refused  Intimate Partner Violence: Not At Risk  . Fear of Current or Ex-Partner: No  . Emotionally Abused: No  . Physically Abused: No  . Sexually Abused: No    Review of Systems: Gen: Lightheadedness, dizziness, presyncope, syncope. CV: Denies chest  pain or heart palpitations. Resp: Denies shortness of breath or cough. GI: See HPI GU : Denies urinary burning, urinary frequency, urinary hesitancy, hematuria. MS: Denies joint pain Derm: Denies rash Psych: Denies depression or anxiety Heme: See HPI  Physical Exam: BP 124/80   Pulse (!) 106   Temp 97.7 F (36.5 C) (Oral)   Ht 5\' 3"  (1.6 m)   Wt 198 lb 9.6 oz (90.1 kg)   LMP 04/18/2020   BMI 35.18 kg/m  General:   Alert and oriented. Pleasant and cooperative. Well-nourished and well-developed.  Head:  Normocephalic and atraumatic. Eyes:  Without icterus, sclera clear and conjunctiva pink.  Ears:  Normal auditory acuity. Lungs:  Clear to auscultation bilaterally. No wheezes, rales, or rhonchi. No distress.  Heart:  S1, S2 present  without murmurs appreciated.  Abdomen:  +BS, soft, non-distended.  Moderate tenderness to palpation in the epigastric and right upper quadrant.  Mild tenderness to palpation in the left upper quadrant.  Minimal tenderness palpation in the left lower quadrant.  No HSM noted. No guarding or rebound. No masses appreciated.  Rectal:  Deferred  Msk:  Symmetrical without gross deformities. Normal posture. Extremities:  Without edema. Neurologic:  Alert and  oriented x4;  grossly normal neurologically. Skin:  Intact without significant lesions or rashes. Psych:  Alert and cooperative. Normal mood and affect.

## 2020-04-25 ENCOUNTER — Ambulatory Visit (INDEPENDENT_AMBULATORY_CARE_PROVIDER_SITE_OTHER): Payer: Medicaid Other | Admitting: Gastroenterology

## 2020-04-25 ENCOUNTER — Telehealth: Payer: Self-pay

## 2020-04-25 ENCOUNTER — Encounter: Payer: Self-pay | Admitting: Gastroenterology

## 2020-04-25 ENCOUNTER — Other Ambulatory Visit: Payer: Self-pay

## 2020-04-25 VITALS — BP 124/80 | HR 106 | Temp 97.7°F | Ht 63.0 in | Wt 198.6 lb

## 2020-04-25 DIAGNOSIS — K5900 Constipation, unspecified: Secondary | ICD-10-CM | POA: Diagnosis not present

## 2020-04-25 DIAGNOSIS — R1010 Upper abdominal pain, unspecified: Secondary | ICD-10-CM

## 2020-04-25 NOTE — Assessment & Plan Note (Addendum)
Patient reports persistent upper abdominal pain, worse in the RUQ x2 months that is constant but worsens with meals and at night.  Ports symptoms starting after cholecystectomy in February 2021.  Ultrasound prior to cholecystectomy with cholelithiasis, normal CBD.  Denies nausea, vomiting, fever, GERD symptoms, dysphagia, bright red blood per rectum, or melena.  1 ibuprofen a few nights a week for abdominal pain x2 months.  Has been taking Protonix 40 mg daily for the last 3 weeks without any significant change in symptoms.  Abdominal exam with moderate tenderness to palpation in the epigastric and right upper quadrant area.  Mild tenderness to palpation in the left upper quadrant.  Differentials include retained CBD stone, microlithiasis, complications from cholecystectomy including postop bile leak, gastritis, duodenitis, PUD.   We will start with updating labs including CBC, CMP, and lipase and obtaining right upper quadrant ultrasound ASAP.  If labs and ultrasound are unremarkable, will plan to increase Protonix to twice daily and EGD.  May consider CT scan in the interim due to severity of pain and EGD likely not available until June or July.  Continue Protonix 40 mg daily 30 minutes before breakfast. Avoid fried, fatty, greasy, spicy, citrus foods. Avoid caffeine and carbonated beverages. Consume lean meats (poultry and fish). All meats should be baked, boiled, or broiled. Avoid all NSAIDs.  You may take Tylenol as needed. Do not take more than 2 g daily. Drink enough water to keep urine pale yellow to clear. Advised to proceed to the emergency room if she develops severe abdominal pain, nausea, vomiting, or fever.  Further recommendations to follow.

## 2020-04-25 NOTE — Telephone Encounter (Signed)
PA for [place] abd RUQ submitted via [employer]. Case approved. PA## [id number], valid 04/25/20-10/22/20.  [place] scheduled for 04/28/20 at 8:30am, arrive at 8:15am. NPO after midnight prior to test.   Called and informed pt of [place] appt.

## 2020-04-25 NOTE — Assessment & Plan Note (Signed)
New onset of constipation with hard stools every 2-3 days requiring straining.  Denies bright red blood per rectum or melena.  This is in the setting of decreased oral intake over the last couple of months due to upper abdominal pain as discussed below.  Suspect this is likely influencing her constipation.  Currently taking stool softeners or OTC "clear liquid" as needed.  Abdominal exam with minimal tenderness to palpation in the left lower quadrant.  Start MiraLAX 1 capful (17 g) daily in 8 ounces of water.  Advised to decrease frequency if she develops loose stools. Drink enough water to keep urine pale yellow to clear. Follow-up timing to be determined based on labs and ultrasound ordered for upper abdominal pain as discussed below.

## 2020-04-25 NOTE — Patient Instructions (Signed)
Please have labs completed at [facility] lab.  We will get you scheduled for an ultrasound as soon as possible.  Continue taking Protonix 40 mg daily 30 minutes before breakfast.  Avoid fried, fatty, greasy, spicy, citrus foods for now. Avoid caffeine and carbonated beverages. Consume lean meats (poultry and fish). All meats should be baked, boiled, or broiled.  Avoid all NSAIDs for now. This includes ibuprofen, Aleve, Advil, and Goody powders.  You may take Tylenol as needed. Do not take more than 2 g daily.  Drink enough water to keep urine pale yellow to clear.  Start MiraLAX one capful (17 g) daily in 8 ounces of water for constipation. You may decrease frequency if you develop loose stools.  Further recommendations to follow completion of your labs and ultrasound. Call with any worsening symptoms. If you develop severe abdominal pain, nausea, vomiting, or fever, you should proceed to the emergency room.  Ermalinda Memos, PA-C [facility]

## 2020-04-28 ENCOUNTER — Ambulatory Visit (HOSPITAL_COMMUNITY): Payer: Medicaid Other

## 2020-04-28 NOTE — Progress Notes (Signed)
Cc'ed to pcp °

## 2020-05-08 ENCOUNTER — Encounter: Payer: Self-pay | Admitting: Family Medicine

## 2020-05-08 ENCOUNTER — Ambulatory Visit: Payer: Medicaid Other | Admitting: Family Medicine

## 2020-05-21 DIAGNOSIS — G8929 Other chronic pain: Secondary | ICD-10-CM | POA: Diagnosis not present

## 2020-05-21 DIAGNOSIS — M5441 Lumbago with sciatica, right side: Secondary | ICD-10-CM | POA: Diagnosis not present

## 2020-06-04 DIAGNOSIS — G8929 Other chronic pain: Secondary | ICD-10-CM | POA: Diagnosis not present

## 2020-06-04 DIAGNOSIS — M545 Low back pain: Secondary | ICD-10-CM | POA: Diagnosis not present

## 2020-06-14 ENCOUNTER — Emergency Department (HOSPITAL_COMMUNITY): Payer: Medicaid Other

## 2020-06-14 ENCOUNTER — Encounter (HOSPITAL_COMMUNITY): Payer: Self-pay | Admitting: Emergency Medicine

## 2020-06-14 ENCOUNTER — Emergency Department (HOSPITAL_COMMUNITY)
Admission: EM | Admit: 2020-06-14 | Discharge: 2020-06-14 | Disposition: A | Discharge status: Home / Self Care | Payer: Medicaid Other | Attending: Emergency Medicine | Admitting: Emergency Medicine

## 2020-06-14 ENCOUNTER — Other Ambulatory Visit: Payer: Self-pay

## 2020-06-14 DIAGNOSIS — S99912A Unspecified injury of left ankle, initial encounter: Secondary | ICD-10-CM | POA: Diagnosis not present

## 2020-06-14 DIAGNOSIS — S8252XA Displaced fracture of medial malleolus of left tibia, initial encounter for closed fracture: Secondary | ICD-10-CM | POA: Diagnosis not present

## 2020-06-14 DIAGNOSIS — S90112A Contusion of left great toe without damage to nail, initial encounter: Secondary | ICD-10-CM | POA: Insufficient documentation

## 2020-06-14 DIAGNOSIS — F17210 Nicotine dependence, cigarettes, uncomplicated: Secondary | ICD-10-CM | POA: Diagnosis not present

## 2020-06-14 DIAGNOSIS — W010XXA Fall on same level from slipping, tripping and stumbling without subsequent striking against object, initial encounter: Secondary | ICD-10-CM | POA: Diagnosis not present

## 2020-06-14 DIAGNOSIS — Y999 Unspecified external cause status: Secondary | ICD-10-CM | POA: Insufficient documentation

## 2020-06-14 DIAGNOSIS — Z885 Allergy status to narcotic agent status: Secondary | ICD-10-CM | POA: Insufficient documentation

## 2020-06-14 DIAGNOSIS — Y929 Unspecified place or not applicable: Secondary | ICD-10-CM | POA: Insufficient documentation

## 2020-06-14 DIAGNOSIS — W19XXXA Unspecified fall, initial encounter: Secondary | ICD-10-CM

## 2020-06-14 DIAGNOSIS — Y939 Activity, unspecified: Secondary | ICD-10-CM | POA: Insufficient documentation

## 2020-06-14 DIAGNOSIS — Z23 Encounter for immunization: Secondary | ICD-10-CM | POA: Insufficient documentation

## 2020-06-14 MED ORDER — TETANUS-DIPHTH-ACELL PERTUSSIS 5-2.5-18.5 LF-MCG/0.5 IM SUSP
0.5000 mL | Freq: Once | INTRAMUSCULAR | Status: AC
  Administered 2020-06-14: 0.5 mL via INTRAMUSCULAR
  Filled 2020-06-14: qty 0.5

## 2020-06-14 NOTE — Discharge Instructions (Addendum)
There is possibly a small avulsion fracture of the middle portion of your ankle.  I would suggest Tylenol and ibuprofen as needed for pain.  You may ice and elevate the extremity.  Follow-up with [facility].  Return for any worsening symptoms.

## 2020-06-14 NOTE — ED Triage Notes (Signed)
Pt c/o left ankle pain after falling today. Pt states she tripped over a piece of wood with nails in it and her "leg went behind her." pt has a small wound on her left great toe that she thinks a nail went into.

## 2020-06-14 NOTE — ED Provider Notes (Signed)
[facility] EMERGENCY DEPARTMENT Provider Note   CSN: [id number] Arrival date & time: 06/14/20  2041    History Chief Complaint  Patient presents with  . Ankle Pain    Joanna Higgins is a 19 y.o. female with past medical history sent for DVT, D&C who presents for evaluation of fall.  Patient states she slipped and fell inverting her left ankle.  She has pain and swelling to her left ankle.  She has contusion to her left medial aspect of her great toe.  Pain worse with ambulation.  She is not take anything for pain.  Rates her pain a 6/10.  Not want anything for pain here in the ED.  She denies hitting her head, LOC or anticoagulation.  Denies fever, chills, nausea, vomiting, chest pain, shortness of breath, abdominal pain, diarrhea, dysuria, redness, warmth, bleeding or drainage.  Denies aggravating relieving factors.  History obtained from patient past medical records.  Interpreter used.  Followed by [facility] orthopedics.  HPI     Past Medical History:  Diagnosis Date  . Bladder instability   . Bulging lumbar disc   . DIC (disseminated intravascular coagulation) (HCC)   . DVT (deep venous thrombosis) (HCC)    2016  . Irregular menstrual bleeding 02/10/2016  . Scoliosis   . Vascular malformation     Patient Active Problem List   Diagnosis Date Noted  . Constipation 04/25/2020  . Pain of upper abdomen 04/25/2020  . Acute cystitis with hematuria 03/31/2020  . Nonintractable episodic headache 03/31/2020  . Calculus of gallbladder without cholecystitis without obstruction   . Screening examination for STD (sexually transmitted disease) 04/18/2019  . Nexplanon in place 04/18/2019  . Periumbilical abdominal pain 04/18/2019  . Nexplanon insertion 07/25/2018  . Irregular menstrual bleeding 02/10/2016  . Learning disability 08/22/2015  . ADD (attention deficit disorder) 08/22/2015  . 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 DUB (dysfunctional uterine bleeding) 12/03/2013  . Anemia 12/03/2013  . Scoliosis 04/28/2013  . Adiposity 10/19/2012  . 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 CHOLECYSTECTOMY N/A 02/13/2020   Procedure: LAPAROSCOPIC CHOLECYSTECTOMY;  Surgeon: Franky Macho, MD;  Location: [facility];  Service: General;  Laterality: N/A;  . UPPER ENDOSCOPY W/ SCLEROTHERAPY     ?patient denies     OB History    Gravida  0   Para  0   Term  0   Preterm  0   AB  0   Living  0     SAB  0   TAB  0   Ectopic  0   Multiple  0   Live Births              Family History  Problem Relation Age of Onset  . Arthritis Mother   . Arthritis Father   . Arthritis Maternal Grandmother   . Diabetes Maternal Grandfather   . Arthritis Maternal Grandfather   . Arthritis Paternal Grandfather   . Colon cancer Neg Hx   . Pancreatic cancer Neg Hx     Social History   Tobacco Use  . Smoking status: Current Every Day Smoker    Packs/day: 1.00    Years: 3.00    Pack years: 3.00    Types: E-cigarettes, Cigarettes  . Smokeless tobacco: Never Used  Vaping Use  . Vaping Use: Former  . Quit date: 02/10/2019  Substance Use Topics  .  Alcohol use: No    Alcohol/week: 0.0 standard drinks  . Drug use: No    Home Medications Prior to Admission medications   Medication Sig Start Date End Date Taking? Authorizing Provider  etonogestrel (NEXPLANON) 68 MG IMPL implant 1 each by Subdermal route once.   Yes [provider]  pantoprazole (PROTONIX) 40 MG tablet Take 1 tablet (40 mg total) by mouth daily. Patient not taking: Reported on 06/14/2020 04/01/20   Babs Sciara, MD    Allergies    Estrogens and Hydrocodone  Review of Systems   Review of Systems  Constitutional: Negative.   HENT: Negative.   Respiratory: Negative.   Cardiovascular: Negative.   Gastrointestinal: Negative.   Genitourinary:  Negative.   Musculoskeletal:       Left ankle pain  Skin: Negative.   Neurological: Negative.   All other systems reviewed and are negative.   Physical Exam Updated Vital Signs BP 121/77 (BP Location: Right Arm)   Pulse (!) 113   Temp 98.8 F (37.1 C) (Oral)   Resp 17   Ht 5\' 3"  (1.6 m)   Wt 88.9 kg   SpO2 97%   BMI 34.72 kg/m   Physical Exam Vitals and nursing note reviewed.  Constitutional:      General: She is not in acute distress.    Appearance: She is well-developed. She is not ill-appearing, toxic-appearing or diaphoretic.  HENT:     Head: Normocephalic and atraumatic.     Mouth/Throat:     Mouth: Mucous membranes are moist.  Eyes:     Pupils: Pupils are equal, round, and reactive to light.  Cardiovascular:     Rate and Rhythm: Normal rate.     Pulses: Normal pulses.          Dorsalis pedis pulses are 2+ on the right side and 2+ on the left side.     Heart sounds: Normal heart sounds.  Pulmonary:     Effort: Pulmonary effort is normal. No respiratory distress.     Breath sounds: Normal breath sounds.  Abdominal:     General: Bowel sounds are normal. There is no distension.  Musculoskeletal:        General: Normal range of motion.     Cervical back: Normal range of motion.     Right knee: Normal.     Left knee: Normal.     Right lower leg: Normal.     Left lower leg: No swelling, deformity, lacerations, tenderness or bony tenderness.     Right ankle: Normal.     Right Achilles Tendon: Normal.     Left ankle: Swelling present. No deformity, ecchymosis or lacerations. Tenderness present over the lateral malleolus, medial malleolus and ATF ligament. Normal range of motion.     Left Achilles Tendon: Normal.     Right foot: Normal.     Left foot: Normal.       Feet:     Comments: Tenderness to left medial and lateral malleolus.  No tenderness to midshaft tibia-fibula or proximal tibia or fibula.  Able to flex and extend at bilateral knees.  Able to straight  leg raise without difficulty.  Wiggles toes without difficulty.  Some mild tenderness over her left ATFL.  No bony tenderness to toes.  No midline spinal tenderness  Feet:     Comments: Contusion to medial aspect of left great toe.  No bleeding or drainage.  No laceration to suture. Skin:    General: Skin is warm  and dry.     Capillary Refill: Capillary refill takes less than 2 seconds.     Comments: Contusion to the left great toe.  No nailbed damage.  No bleeding or drainage.  No lacerations to suture.  Soft tissue swelling to left medial lateral malleolus.  Neurological:     Mental Status: She is alert.     Sensory: Sensation is intact.     Motor: Motor function is intact.     Coordination: Coordination is intact.     Comments: Unable to ambulate secondary to pain.     ED Results / Procedures / Treatments   Labs (all labs ordered are listed, but only abnormal results are displayed) Labs Reviewed - No data to display  EKG None  Radiology DG Ankle Complete Left  Result Date: 06/14/2020 CLINICAL DATA:  19 year old female with fall and trauma to the left lower extremity. EXAM: LEFT FOOT - COMPLETE 3+ VIEW; LEFT ANKLE COMPLETE - 3+ VIEW COMPARISON:  Left ankle radiograph dated 05/19/2014. FINDINGS: Small bone fragment inferior to the medial malleolus may be chronic or represent a nondisplaced cortical avulsion injury. Correlation with clinical exam and point tenderness recommended. No other acute fracture identified. The bones are well mineralized. There is no dislocation. The ankle mortise is intact. The soft tissues are unremarkable. IMPRESSION: Chronic changes versus a nondisplaced cortical avulsion injury of the medial malleolus. Electronically Signed   By: Anner Crete M.D.   On: 06/14/2020 21:40   DG Foot Complete Left  Result Date: 06/14/2020 CLINICAL DATA:  19 year old female with fall and trauma to the left lower extremity. EXAM: LEFT FOOT - COMPLETE 3+ VIEW; LEFT ANKLE  COMPLETE - 3+ VIEW COMPARISON:  Left ankle radiograph dated 05/19/2014. FINDINGS: Small bone fragment inferior to the medial malleolus may be chronic or represent a nondisplaced cortical avulsion injury. Correlation with clinical exam and point tenderness recommended. No other acute fracture identified. The bones are well mineralized. There is no dislocation. The ankle mortise is intact. The soft tissues are unremarkable. IMPRESSION: Chronic changes versus a nondisplaced cortical avulsion injury of the medial malleolus. Electronically Signed   By: Anner Crete M.D.   On: 06/14/2020 21:40   Procedures Procedures (including critical care time)  Medications Ordered in ED Medications  Tdap (BOOSTRIX) injection 0.5 mL (0.5 mLs Intramuscular Given 06/14/20 2231)   ED Course  I have reviewed the triage vital signs and the nursing notes.  Pertinent labs & imaging results that were available during my care of the patient were reviewed by me and considered in my medical decision making (see chart for details).  19 year old female presents for evaluation of ankle pain after mechanical fall.  She is neurovascularly intact.  There is some mild swelling to medial lateral malleolus.  No tenderness to mid tib-fib, proximal tib-fib or bony tenderness of foot.  She has skin tear to her left medial great toe however no lacerations to suture.  Unknown last tetanus, will update here in the ED.  She denies hitting her head, LOC or anticoagulation.  Her compartments are soft.  No evidence of compartment syndrome.  X-ray with possible chronic changes versus acute nondisplaced avulsion fracture medial malleolus.  Given acute versus chronic changes will place in splint, crutches.  RICE for symptomatic management.  She is ready followed by [facility] orthopedics.  Will refer outpatient to them.  The patient has been appropriately medically screened and/or stabilized in the ED. I have low suspicion for any other emergent  medical condition  which would require further screening, evaluation or treatment in the ED or require inpatient management.  Patient is hemodynamically stable and in no acute distress.  Patient able to ambulate in department prior to ED.  Evaluation does not show acute pathology that would require ongoing or additional emergent interventions while in the emergency department or further inpatient treatment.  I have discussed the diagnosis with the patient and answered all questions.  Pain is been managed while in the emergency department and patient has no further complaints prior to discharge.  Patient is comfortable with plan discussed in room and is stable for discharge at this time.  I have discussed strict return precautions for returning to the emergency department.  Patient was encouraged to follow-up with PCP/specialist refer to at discharge.   MDM Rules/Calculators/A&P                           Final Clinical Impression(s) / ED Diagnoses Final diagnoses:  Fall, initial encounter  Closed avulsion fracture of medial malleolus of left tibia, initial encounter    Rx / DC Orders ED Discharge Orders    None       Rockford Leinen A, PA-C 06/14/20 2315    Bethann Berkshire, MD 06/16/20 1048

## 2020-06-17 DIAGNOSIS — M545 Low back pain: Secondary | ICD-10-CM | POA: Diagnosis not present

## 2020-06-17 DIAGNOSIS — M5416 Radiculopathy, lumbar region: Secondary | ICD-10-CM | POA: Diagnosis not present

## 2020-06-18 ENCOUNTER — Telehealth (INDEPENDENT_AMBULATORY_CARE_PROVIDER_SITE_OTHER): Payer: Medicaid Other | Admitting: Family Medicine

## 2020-06-18 ENCOUNTER — Telehealth: Payer: Self-pay | Admitting: *Deleted

## 2020-06-18 DIAGNOSIS — J019 Acute sinusitis, unspecified: Secondary | ICD-10-CM | POA: Diagnosis not present

## 2020-06-18 DIAGNOSIS — F5104 Psychophysiologic insomnia: Secondary | ICD-10-CM

## 2020-06-18 MED ORDER — AMOXICILLIN 500 MG PO TABS: 500.0000 mg | ORAL_TABLET | Freq: Three times a day (TID) | ORAL | 0 refills | Status: DC

## 2020-06-18 NOTE — Telephone Encounter (Signed)
[name], Joanna Higgins are scheduled for a virtual visit with your provider today.    Just as we do with appointments in the office, we must obtain your consent to participate.  Your consent will be active for this visit and any virtual visit you may have with one of our providers in the next 365 days.    If you have a MyChart account, I can also send a copy of this consent to you electronically.  All virtual visits are billed to your insurance company just like a traditional visit in the office.  As this is a virtual visit, video technology does not allow for your provider to perform a traditional examination.  This may limit your provider's ability to fully assess your condition.  If your provider identifies any concerns that need to be evaluated in person or the need to arrange testing such as labs, EKG, etc, we will make arrangements to do so.    Although advances in technology are sophisticated, we cannot ensure that it will always work on either your end or our end.  If the connection with a video visit is poor, we may have to switch to a telephone visit.  With either a video or telephone visit, we are not always able to ensure that we have a secure connection.   I need to obtain your verbal consent now.   Are you willing to proceed with your visit today?   Joanna Higgins has provided verbal consent on 06/18/2020 for a virtual visit (video or telephone).   Haze Rushing, LPN 6/33/3545  6:25 PM

## 2020-06-18 NOTE — Progress Notes (Signed)
   Subjective:    Patient ID: Joanna Higgins, female    DOB: 08-22-2001, 19 y.o.   MRN: [id number]  URI  This is a new problem. The current episode started yesterday. There has been no fever. Associated symptoms include congestion, coughing, nausea and vomiting. She has tried nothing for the symptoms.  The patient relates that she is having a lot of head congestion drainage and coughing she denies any high fever chills or sweats.  She states her energy level is okay.  She did throw up after eating but is able to keep things down now currently She denies being exposed to Covid When I discussed with her the about doing Covid testing she stated that " I know I do not have Covid" Virtual Visit via Video Note  I connected with Joanna Higgins on 06/18/20 at  3:00 PM EDT by a video enabled telemedicine application and verified that I am speaking with the correct person using two identifiers.  Location: Patient: home Provider: office    I discussed the limitations of evaluation and management by telemedicine and the availability of in person appointments. The patient expressed understanding and agreed to proceed.  History of Present Illness:    Observations/Objective:   Assessment and Plan:   Follow Up Instructions:    I discussed the assessment and treatment plan with the patient. The patient was provided an opportunity to ask questions and all were answered. The patient agreed with the plan and demonstrated an understanding of the instructions.   The patient was advised to call back or seek an in-person evaluation if the symptoms worsen or if the condition fails to improve as anticipated.  I provided 15 minutes of non-face-to-face time during this encounter. She relates bringing up yellow mucus  Review of Systems  HENT: Positive for congestion.   Respiratory: Positive for cough.   Gastrointestinal: Positive for nausea and vomiting.       Objective:   Physical Exam  Today's  visit was via telephone Physical exam was not possible for this visit       Assessment & Plan:  Acute rhinosinusitis Antibiotic prescribed warning signs discussed I told her there is a possibility this could be Covid she should stay away from others while she is sick and she should consider testing She defers on testing  As a last minute issue she states she has not been sleeping well and this has been the case for months if not years she has tried melatonin and diphenhydramine we will send her behavioral tips on how to sleep better

## 2020-07-10 ENCOUNTER — Other Ambulatory Visit: Payer: Self-pay | Admitting: Orthopedic Surgery

## 2020-07-10 DIAGNOSIS — M5416 Radiculopathy, lumbar region: Secondary | ICD-10-CM

## 2020-07-16 ENCOUNTER — Inpatient Hospital Stay: Admission: RE | Admit: 2020-07-16 | Payer: Medicaid Other | Source: Ambulatory Visit

## 2020-07-23 ENCOUNTER — Inpatient Hospital Stay: Admission: RE | Admit: 2020-07-23 | Payer: Medicaid Other | Source: Ambulatory Visit

## 2020-07-25 ENCOUNTER — Other Ambulatory Visit: Payer: Self-pay

## 2020-07-25 ENCOUNTER — Emergency Department (HOSPITAL_COMMUNITY)
Admission: EM | Admit: 2020-07-25 | Discharge: 2020-07-25 | Disposition: A | Discharge status: Home / Self Care | Payer: Medicaid Other

## 2020-07-28 ENCOUNTER — Ambulatory Visit
Admission: EM | Admit: 2020-07-28 | Discharge: 2020-07-28 | Disposition: A | Discharge status: Home / Self Care | Payer: Medicaid Other | Attending: Emergency Medicine | Admitting: Emergency Medicine

## 2020-07-28 ENCOUNTER — Other Ambulatory Visit: Payer: Self-pay

## 2020-07-28 ENCOUNTER — Encounter: Payer: Self-pay | Admitting: Emergency Medicine

## 2020-07-28 DIAGNOSIS — G43009 Migraine without aura, not intractable, without status migrainosus: Secondary | ICD-10-CM

## 2020-07-28 MED ORDER — METHYLPREDNISOLONE SODIUM SUCC 125 MG IJ SOLR
125.0000 mg | Freq: Once | INTRAMUSCULAR | Status: AC
  Administered 2020-07-28: 125 mg via INTRAMUSCULAR

## 2020-07-28 MED ORDER — KETOROLAC TROMETHAMINE 60 MG/2ML IM SOLN
60.0000 mg | Freq: Once | INTRAMUSCULAR | Status: AC
  Administered 2020-07-28: 60 mg via INTRAMUSCULAR

## 2020-07-28 MED ORDER — DEXAMETHASONE SODIUM PHOSPHATE 10 MG/ML IJ SOLN: 10.0000 mg | Freq: Once | INTRAMUSCULAR | Status: DC

## 2020-07-28 MED ORDER — SUMATRIPTAN SUCCINATE 6 MG/0.5ML ~~LOC~~ SOLN
6.0000 mg | Freq: Once | SUBCUTANEOUS | Status: AC
  Administered 2020-07-28: 6 mg via SUBCUTANEOUS

## 2020-07-28 NOTE — ED Provider Notes (Signed)
[facility] CARE CENTER   [id number] 07/28/20 Arrival Time: 1509  BT:[id number]  SUBJECTIVE:  Joanna Higgins is a 19 y.o. female who complains of migraine x 2 weeks.  Denies a precipitating event, or recent head trauma.  Patient localizes her pain to the forehead head.  Describes the pain as constant and throbbing in character.  Patient has tried OTC medications without relief. Denies aggravating factors. Denies similar symptoms in the past.  States this is longest HA she has had.  Patient denies fever, chills, nausea, vomiting, aura, rhinorrhea, watery eyes, chest pain, SOB, abdominal pain, weakness, numbness or tingling, slurred speech.    ROS: As per HPI.  All other pertinent ROS negative.     Past Medical History:  Diagnosis Date  . Bladder instability   . Bulging lumbar disc   . DIC (disseminated intravascular coagulation) (HCC)   . DVT (deep venous thrombosis) (HCC)    2016  . Irregular menstrual bleeding 02/10/2016  . Scoliosis   . Vascular malformation    Past Surgical History:  Procedure Laterality Date  . CHOLECYSTECTOMY N/A 02/13/2020   Procedure: LAPAROSCOPIC CHOLECYSTECTOMY;  Surgeon: Franky Macho, MD;  Location: [facility];  Service: General;  Laterality: N/A;  . UPPER ENDOSCOPY W/ SCLEROTHERAPY     ?patient denies   Allergies  Allergen Reactions  . 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 Hydrocodone Nausea And Vomiting   No current facility-administered medications on file prior to encounter.   Current Outpatient Medications on File Prior to Encounter  Medication Sig Dispense Refill  . amoxicillin (AMOXIL) 500 MG tablet Take 1 tablet (500 mg total) by mouth 3 (three) times daily. 21 tablet 0  . etonogestrel (NEXPLANON) 68 MG IMPL implant 1 each by Subdermal route once.    . pantoprazole (PROTONIX) 40 MG tablet Take 1 tablet (40 mg total) by mouth daily. (Patient not taking: Reported on 06/14/2020) 30  tablet 3   Social History   Socioeconomic History  . Marital status: Single    Spouse name: Not on file  . Number of children: 0  . Years of education: Not on file  . Highest education level: Not on file  Occupational History  . Not on file  Tobacco Use  . Smoking status: Current Every Day Smoker    Packs/day: 1.00    Years: 3.00    Pack years: 3.00    Types: E-cigarettes, Cigarettes  . Smokeless tobacco: Never Used  Vaping Use  . Vaping Use: Former  . Quit date: 02/10/2019  Substance and Sexual Activity  . Alcohol use: No    Alcohol/week: 0.0 standard drinks  . Drug use: No  . Sexual activity: Yes    Birth control/protection: Implant  Other Topics Concern  . Not on file  Social History Narrative  . Not on file   Social Determinants of Health   Financial Resource Strain: Low Risk   . Difficulty of Paying Living Expenses: Not hard at all  Food Insecurity: Food Insecurity Present  . Worried About Programme researcher, broadcasting/film/video in the Last Year: Sometimes true  . Ran Out of Food in the Last Year: Sometimes true  Transportation Needs: No Transportation Needs  . Lack of Transportation (Medical): No  . Lack of Transportation (Non-Medical): No  Physical Activity: Inactive  . Days of Exercise per Week: 0 days  . Minutes of Exercise per Session: 0 min  Stress: No Stress Concern Present  .  Feeling of Stress : Not at all  Social Connections: Unknown  . Frequency of Communication with Friends and Family: Never  . Frequency of Social Gatherings with Friends and Family: Never  . Attends Religious Services: Never  . Active Member of Clubs or Organizations: No  . Attends Banker Meetings: Never  . Marital Status: Patient refused  Intimate Partner Violence: Not At Risk  . Fear of Current or Ex-Partner: No  . Emotionally Abused: No  . Physically Abused: No  . Sexually Abused: No   Family History  Problem Relation Age of Onset  . Arthritis Mother   . Arthritis Father     . Arthritis Maternal Grandmother   . Diabetes Maternal Grandfather   . Arthritis Maternal Grandfather   . Arthritis Paternal Grandfather   . Colon cancer Neg Hx   . Pancreatic cancer Neg Hx     OBJECTIVE:  Vitals:   07/28/20 1520 07/28/20 1522  BP:  97/67  Pulse:  (!) 117  Resp:  18  Temp:  98.5 F (36.9 C)  TempSrc:  Oral  SpO2:  96%  Weight: 194 lb (88 kg)   Height: 5\' 3"  (1.6 m)     General appearance: alert; no distress Eyes: PERRLA; EOMI HENT: normocephalic; atraumatic Neck: supple with FROM Lungs: clear to auscultation bilaterally Heart: regular rate and rhythm.   Extremities: no edema; symmetrical with no gross deformities Skin: warm and dry Neurologic: CN 2-12 grossly intact; finger to nose without difficulty; normal gait; strength and sensation intact bilaterally about the upper and lower extremities; negative pronator drift Psychological: alert and cooperative; normal mood and affect   ASSESSMENT & PLAN:  1. Migraine without aura and without status migrainosus, not intractable     Meds ordered this encounter  Medications  . dexamethasone (DECADRON) injection 10 mg  . SUMAtriptan (IMITREX) injection 6 mg  . ketorolac (TORADOL) injection 60 mg   Unable to rule out stroke or other neurological process in urgent care setting.  Offered patient further evaluation and management in the ED.  Patient declines at this time and would like to try outpatient therapy first.  Aware of the risk associated with this decision including missed diagnosis, organ damage, organ failure, and/or death.  Patient aware and in agreement.     Migraine cocktail given in office Rest and drink plenty of fluids Use OTC medications as needed for symptomatic relief Follow up with PCP if symptoms persists Return or go to the ER if you have any new or worsening symptoms such as fever, chills, nausea, vomiting, chest pain, shortness of breath, cough, vision changes, worsening headache  despite treatment, slurred speech, facial asymmetry, weakness in arms or legs, etc...  Reviewed expectations re: course of current medical issues. Questions answered. Outlined signs and symptoms indicating need for more acute intervention. Patient verbalized understanding. After Visit Summary given.   , PA-C 07/28/20 1601

## 2020-07-28 NOTE — ED Triage Notes (Signed)
Lightheaded, dizzy and headaches on and off x 2 weeks

## 2020-07-28 NOTE — Discharge Instructions (Addendum)
Unable to rule out stroke or other neurological process in urgent care setting.  Offered patient further evaluation and management in the ED.  Patient declines at this time and would like to try outpatient therapy first.  Aware of the risk associated with this decision including missed diagnosis, organ damage, organ failure, and/or death.  Patient aware and in agreement.     Migraine cocktail given in office Rest and drink plenty of fluids Use OTC medications as needed for symptomatic relief Follow up with PCP if symptoms persists Return or go to the ER if you have any new or worsening symptoms such as fever, chills, nausea, vomiting, chest pain, shortness of breath, cough, vision changes, worsening headache despite treatment, slurred speech, facial asymmetry, weakness in arms or legs, etc..Marland Kitchen

## 2020-07-30 ENCOUNTER — Other Ambulatory Visit: Payer: Self-pay

## 2020-07-30 ENCOUNTER — Ambulatory Visit
Admission: RE | Admit: 2020-07-30 | Discharge: 2020-07-30 | Disposition: A | Discharge status: Home / Self Care | Payer: Medicaid Other | Source: Ambulatory Visit | Attending: Orthopedic Surgery | Admitting: Orthopedic Surgery

## 2020-07-30 DIAGNOSIS — M5416 Radiculopathy, lumbar region: Secondary | ICD-10-CM

## 2020-07-30 DIAGNOSIS — M545 Low back pain: Secondary | ICD-10-CM | POA: Diagnosis not present

## 2020-07-30 MED ORDER — IOPAMIDOL (ISOVUE-M 200) INJECTION 41%
1.0000 mL | Freq: Once | INTRAMUSCULAR | Status: AC
  Administered 2020-07-30: 1 mL via EPIDURAL

## 2020-07-30 MED ORDER — METHYLPREDNISOLONE ACETATE 40 MG/ML INJ SUSP (RADIOLOG
120.0000 mg | Freq: Once | INTRAMUSCULAR | Status: AC
  Administered 2020-07-30: 120 mg via EPIDURAL

## 2020-07-30 NOTE — Discharge Instructions (Signed)

## 2020-09-14 DIAGNOSIS — M79673 Pain in unspecified foot: Secondary | ICD-10-CM | POA: Diagnosis not present

## 2020-09-14 DIAGNOSIS — Z5321 Procedure and treatment not carried out due to patient leaving prior to being seen by health care provider: Secondary | ICD-10-CM | POA: Diagnosis not present

## 2020-09-14 DIAGNOSIS — R52 Pain, unspecified: Secondary | ICD-10-CM | POA: Diagnosis not present

## 2020-09-15 ENCOUNTER — Emergency Department (HOSPITAL_COMMUNITY)
Admission: EM | Admit: 2020-09-15 | Discharge: 2020-09-15 | Disposition: A | Discharge status: Home / Self Care | Payer: Medicaid Other | Attending: Emergency Medicine | Admitting: Emergency Medicine

## 2020-09-15 DIAGNOSIS — Z5321 Procedure and treatment not carried out due to patient leaving prior to being seen by health care provider: Secondary | ICD-10-CM

## 2020-09-16 ENCOUNTER — Telehealth: Payer: Self-pay

## 2020-09-16 NOTE — Telephone Encounter (Addendum)
Transition Care Management Unsuccessful Follow-up Telephone Call  Date of discharge and from where:  09/15/2020  Attempts:  1st Attempt  Reason for unsuccessful TCM follow-up call:  Missing or invalid number

## 2020-09-19 DIAGNOSIS — M545 Low back pain: Secondary | ICD-10-CM | POA: Diagnosis not present

## 2020-09-25 ENCOUNTER — Ambulatory Visit (INDEPENDENT_AMBULATORY_CARE_PROVIDER_SITE_OTHER): Payer: Medicaid Other | Admitting: Advanced Practice Midwife

## 2020-09-25 ENCOUNTER — Encounter: Payer: Self-pay | Admitting: Advanced Practice Midwife

## 2020-09-25 VITALS — BP 109/70 | HR 88 | Ht 63.0 in | Wt 202.0 lb

## 2020-09-25 DIAGNOSIS — Z3046 Encounter for surveillance of implantable subdermal contraceptive: Secondary | ICD-10-CM

## 2020-09-25 NOTE — Progress Notes (Signed)
HPI:  Joanna Higgins 19 y.o. here for Nexplanon removal. Stated that it's because "it causes blood in my urine".  Discussed how this wasn't the case, but still wants it out . "wants a period".  Discussed that no pg only methods of BC allow for a w/d bleed (except off label POPs) but states "I don't want to be on BC".  Not having sex with anyone right now.  Pt aware that she will be able to get pregnant once the nexplanon is removed.  Past Medical History: Past Medical History:  Diagnosis Date  . Bladder instability   . Bulging lumbar disc   . DIC (disseminated intravascular coagulation) (HCC)   . DVT (deep venous thrombosis) (HCC)    2016  . Irregular menstrual bleeding 02/10/2016  . Scoliosis   . Vascular malformation     Past Surgical History: Past Surgical History:  Procedure Laterality Date  . CHOLECYSTECTOMY N/A 02/13/2020   Procedure: LAPAROSCOPIC CHOLECYSTECTOMY;  Surgeon: Franky Macho, MD;  Location: [facility];  Service: General;  Laterality: N/A;  . UPPER ENDOSCOPY W/ SCLEROTHERAPY     ?patient denies    Family History: Family History  Problem Relation Age of Onset  . Arthritis Mother   . Arthritis Father   . Arthritis Maternal Grandmother   . Diabetes Maternal Grandfather   . Arthritis Maternal Grandfather   . Arthritis Paternal Grandfather   . Colon cancer Neg Hx   . Pancreatic cancer Neg Hx     Social History: Social History   Tobacco Use  . Smoking status: Current Every Day Smoker    Packs/day: 1.00    Years: 3.00    Pack years: 3.00    Types: E-cigarettes, Cigarettes  . Smokeless tobacco: Never Used  Vaping Use  . Vaping Use: Former  . Quit date: 02/10/2019  Substance Use Topics  . Alcohol use: No    Alcohol/week: 0.0 standard drinks  . Drug use: No    Allergies:  Allergies  Allergen Reactions  . 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 Hydrocodone Nausea And Vomiting    Meds: (Not  in a hospital admission)     Patient given informed consent for removal of her Nexplanon, time out was performed.  Signed copy in the chart.  Appropriate time out taken. Implanon site identified.  Area prepped in usual sterile fashon. One cc of 1% lidocaine was used to anesthetize the area at the distal end of the implant. A small stab incision was made right beside the implant on the distal portion.  The Nexplanon rod was grasped using hemostats and removed without difficulty.  There was less than 3 cc blood loss. There were no complications.  A small amount of antibiotic ointment and steri-strips were applied over the small incision.  A pressure bandage was applied to reduce any bruising.  The patient tolerated the procedure well and was given post procedure instructions.

## 2020-10-03 ENCOUNTER — Other Ambulatory Visit (HOSPITAL_COMMUNITY): Payer: Self-pay | Admitting: Orthopedic Surgery

## 2020-10-03 DIAGNOSIS — M545 Low back pain, unspecified: Secondary | ICD-10-CM

## 2020-10-14 ENCOUNTER — Encounter (HOSPITAL_COMMUNITY): Payer: Self-pay

## 2020-10-14 ENCOUNTER — Ambulatory Visit (HOSPITAL_COMMUNITY)
Admission: RE | Admit: 2020-10-14 | Discharge: 2020-10-14 | Disposition: A | Discharge status: Home / Self Care | Payer: Medicaid Other | Source: Ambulatory Visit | Attending: Orthopedic Surgery | Admitting: Orthopedic Surgery

## 2020-10-14 ENCOUNTER — Other Ambulatory Visit: Payer: Self-pay

## 2020-10-14 DIAGNOSIS — M545 Low back pain, unspecified: Secondary | ICD-10-CM

## 2020-10-20 ENCOUNTER — Ambulatory Visit: Payer: Medicaid Other | Admitting: Women's Health

## 2020-10-24 ENCOUNTER — Ambulatory Visit (INDEPENDENT_AMBULATORY_CARE_PROVIDER_SITE_OTHER): Payer: Medicaid Other | Admitting: Family Medicine

## 2020-10-24 ENCOUNTER — Encounter: Payer: Self-pay | Admitting: Family Medicine

## 2020-10-24 ENCOUNTER — Other Ambulatory Visit: Payer: Self-pay

## 2020-10-24 VITALS — HR 133 | Temp 98.4°F | Resp 18

## 2020-10-24 DIAGNOSIS — J029 Acute pharyngitis, unspecified: Secondary | ICD-10-CM

## 2020-10-24 DIAGNOSIS — H66002 Acute suppurative otitis media without spontaneous rupture of ear drum, left ear: Secondary | ICD-10-CM

## 2020-10-24 LAB — POCT RAPID STREP A (OFFICE): Rapid Strep A Screen: NEGATIVE

## 2020-10-24 MED ORDER — AMOXICILLIN-POT CLAVULANATE 875-125 MG PO TABS: 1.0000 | ORAL_TABLET | Freq: Two times a day (BID) | ORAL | 0 refills | Status: DC

## 2020-10-24 NOTE — Progress Notes (Signed)
Patient ID: Joanna Higgins, female    DOB: 2001/06/26, 19 y.o.   MRN: [id number]   Chief Complaint  Patient presents with  . Sinusitis   Subjective:  CC: runny nose, congestion, headache  Seen as an outside visit, complaint of runny nose, congestion, headache.  Symptoms started yesterday.  Also reports cough, sore throat.  Denies abdominal pain denies nausea vomiting diarrhea, denies urinary symptoms.  Reports having difficulty sleeping due to nose being stopped up. congestion since yesterday.    Medical History Joanna Higgins has a past medical history of Bladder instability, Bulging lumbar disc, DIC (disseminated intravascular coagulation) (HCC), DVT (deep venous thrombosis) (HCC), Irregular menstrual bleeding (02/10/2016), Scoliosis, and Vascular malformation.   Outpatient Encounter Medications as of 10/24/2020  Medication Sig  . [DISCONTINUED] etonogestrel (NEXPLANON) 68 MG IMPL implant 1 each by Subdermal route once.  Marland Kitchen amoxicillin-clavulanate (AUGMENTIN) 875-125 MG tablet Take 1 tablet by mouth 2 (two) times daily.   No facility-administered encounter medications on file as of 10/24/2020.     Review of Systems  Constitutional: Negative for chills and fever.  HENT: Positive for rhinorrhea and sore throat.   Respiratory: Positive for cough.   Cardiovascular: Negative for chest pain.  Gastrointestinal: Negative for abdominal pain, diarrhea, nausea and vomiting.  Genitourinary: Negative for dysuria.  Hematological: Negative for adenopathy.     Vitals Pulse (!) 133   Temp 98.4 F (36.9 C)   Resp 18   SpO2 98%   Objective:   Physical Exam Vitals and nursing note reviewed.  Constitutional:      General: She is not in acute distress.    Appearance: She is ill-appearing. She is not toxic-appearing.  HENT:     Right Ear: Tympanic membrane normal.     Left Ear: Tympanic membrane is erythematous and bulging.     Nose: Congestion and rhinorrhea present.     Mouth/Throat:      Pharynx: Posterior oropharyngeal erythema present. No oropharyngeal exudate.  Cardiovascular:     Rate and Rhythm: Tachycardia present.     Heart sounds: Normal heart sounds.     Comments: Nurse reviewed previous visits: heart rate ranges 106-118 (in August at Urgent care visit). All other vitals are within normal limits.  Pulmonary:     Effort: Pulmonary effort is normal.     Breath sounds: Normal breath sounds.  Lymphadenopathy:     Cervical: Cervical adenopathy present.     Left cervical: Superficial cervical adenopathy present.  Skin:    General: Skin is warm and dry.  Neurological:     Mental Status: She is alert and oriented to person, place, and time. Mental status is at baseline.  Psychiatric:        Behavior: Behavior normal.     Comments: Flat affect, answers questions.     Results for orders placed or performed in visit on 10/24/20  POCT rapid strep A  Result Value Ref Range   Rapid Strep A Screen Negative Negative     Assessment and Plan   1. Sore throat - Novel Coronavirus, NAA ([facility]) - POCT rapid strep A - Strep A DNA probe  2. Non-recurrent acute suppurative otitis media of left ear without spontaneous rupture of tympanic membrane - amoxicillin-clavulanate (AUGMENTIN) 875-125 MG tablet; Take 1 tablet by mouth 2 (two) times daily.  Dispense: 20 tablet; Refill: 0   Due to sore throat, rapid strep done, negative, will send for culture.  Due to other symptoms, COVID-19 test sent as well.  Physical exam reveals erythematous throat, and the left otitis media.  She also has a tender left cervical lymph node.  Will treat with Augmentin.  Joanna Higgins's heart rate today was in the 130s, the nurse looked back on her previous visits and was noted that she is consistently greater than 100, all other vital signs were normal.  Encouraged adequate hydration and rest.  Agrees with plan of care discussed today. Understands warning signs to seek further care: Difficulty swallowing,  unable to tolerate liquids, any significant change in health. Understands to follow-up if symptoms do not improve, recommended supportive therapy, stay hydrated, lots of rest.  Will notify once results of Covid test become available.    10/24/2020

## 2020-10-25 LAB — STREP A DNA PROBE: Strep Gp A Direct, DNA Probe: NEGATIVE

## 2020-10-25 LAB — NOVEL CORONAVIRUS, NAA: SARS-CoV-2, NAA: NOT DETECTED

## 2020-10-25 LAB — SPECIMEN STATUS REPORT

## 2020-10-25 LAB — SARS-COV-2, NAA 2 DAY TAT

## 2020-10-31 ENCOUNTER — Encounter: Payer: Self-pay | Admitting: Family Medicine

## 2020-10-31 ENCOUNTER — Telehealth: Payer: Self-pay

## 2020-10-31 ENCOUNTER — Other Ambulatory Visit: Payer: Self-pay | Admitting: Family Medicine

## 2020-10-31 DIAGNOSIS — H9209 Otalgia, unspecified ear: Secondary | ICD-10-CM | POA: Insufficient documentation

## 2020-10-31 MED ORDER — HYDROCORTISONE-ACETIC ACID 1-2 % OT SOLN: 4.0000 [drp] | Freq: Two times a day (BID) | OTIC | 0 refills | Status: DC

## 2020-10-31 NOTE — Telephone Encounter (Signed)
Please advise. Thank you

## 2020-10-31 NOTE — Telephone Encounter (Signed)
Pt was here for Ear pain amoxicillin-clavulanate (AUGMENTIN) 875-125 MG tablet was given to pt and not help she is wanting to see of she can get something for ear drops.   [phone] Pt call back

## 2020-11-17 ENCOUNTER — Ambulatory Visit: Payer: Self-pay | Admitting: Adult Health

## 2020-11-24 ENCOUNTER — Encounter: Payer: Medicaid Other | Admitting: Family Medicine

## 2021-01-14 ENCOUNTER — Other Ambulatory Visit: Payer: Self-pay

## 2021-01-14 ENCOUNTER — Encounter: Payer: Self-pay | Admitting: Adult Health

## 2021-01-14 ENCOUNTER — Ambulatory Visit (INDEPENDENT_AMBULATORY_CARE_PROVIDER_SITE_OTHER): Payer: Medicaid Other | Admitting: Adult Health

## 2021-01-14 VITALS — BP 114/77 | HR 100 | Ht 63.0 in | Wt 202.0 lb

## 2021-01-14 DIAGNOSIS — N898 Other specified noninflammatory disorders of vagina: Secondary | ICD-10-CM | POA: Diagnosis not present

## 2021-01-14 DIAGNOSIS — Z3202 Encounter for pregnancy test, result negative: Secondary | ICD-10-CM | POA: Diagnosis not present

## 2021-01-14 DIAGNOSIS — R1084 Generalized abdominal pain: Secondary | ICD-10-CM | POA: Diagnosis not present

## 2021-01-14 DIAGNOSIS — N926 Irregular menstruation, unspecified: Secondary | ICD-10-CM

## 2021-01-14 LAB — POCT WET PREP (WET MOUNT): Clue Cells Wet Prep Whiff POC: NEGATIVE

## 2021-01-14 LAB — POCT URINE PREGNANCY: Preg Test, Ur: NEGATIVE

## 2021-01-14 NOTE — Progress Notes (Signed)
  Subjective:     Patient ID: Joanna Higgins, female   DOB: May 27, 2001, 20 y.o.   MRN: [id number]  HPI Joanna Higgins is a 20 year old white female,single,G0P0 in complaining of abdominal pain for 3 months and no period since nexplanon removed in September 2021.  PCP is Dr Lilyan Punt  Review of Systems Has had abdominal pain for 3 months, near navel No period since nexplanon removed in September 2021 Denies problems with urination or bowel movements or intercourse Denies any nausea but did vomit once,no bloating, says GB out  She says birth control bothers her stomach She has history of DVT and DIC   Reviewed past medical,surgical, social and family history. Reviewed medications and allergies.      Objective:   Physical Exam BP 114/77 (BP Location: Left Arm, Patient Position: Sitting, Cuff Size: Large)   Pulse 100   Ht 5\' 3"  (1.6 m)   Wt 202 lb (91.6 kg)   BMI 35.78 kg/m  no period since Nexplanon removed in September 2021. UPT is negative. Skin warm and dry. Lungs: clear to ausculation bilaterally. Cardiovascular: regular rate and rhythm. Abdomen has god bowel sounds in all 4 quadrants, soft, no HSM, tender LUQ, and at navel. Pelvic: external genitalia is normal in appearance no lesions, vagina: brownish discharge without odor,urethra has no lesions or masses noted, cervix:smooth, uterus: normal size, shape and contour, non tender, no masses felt, adnexa: no masses,LLQ tenderness noted. Bladder is non tender and no masses felt. Wet prep:  + few WBCs. GC/CHL sent on urine Fall risk is low  Upstream - 01/14/21 1347      Pregnancy Intention Screening   Does the patient want to become pregnant in the next year? No    Does the patient's partner want to become pregnant in the next year? No    Would the patient like to discuss contraceptive options today? Yes      Contraception Wrap Up   Current Method Female Condom    End Method Female Condom    Contraception Counseling Provided Yes          Examination chaperoned by Abby CMA.     Assessment:     1. Pregnancy examination or test, negative result - POCT urine pregnancy  2. Generalized abdominal pain - 01/16/21 PELVIC COMPLETE WITH TRANSVAGINAL; Future - US Abdomen Complete; Future - CBC - Lipase  3. Missed periods - US PELVIC COMPLETE WITH TRANSVAGINAL; Future - Comprehensive metabolic panel - TSH  4. Vaginal discharge Urine sent for GC/CHL  - POCT Wet Prep [facility])    Plan:     Return in 2 weeks to review [facility] and labs, and if still no period, will rx provera to see if can get withdrawal bleed Use condoms  Will try to get her back on Mychart

## 2021-01-14 NOTE — Addendum Note (Signed)
Addended by: Colen Darling on: 01/14/2021 02:43 PM   Modules accepted: Orders

## 2021-01-15 LAB — GC/CHLAMYDIA PROBE AMP
Chlamydia trachomatis, NAA: NEGATIVE
Neisseria Gonorrhoeae by PCR: NEGATIVE

## 2021-01-22 ENCOUNTER — Encounter: Payer: Self-pay | Admitting: Family Medicine

## 2021-01-22 ENCOUNTER — Ambulatory Visit (HOSPITAL_COMMUNITY): Payer: Medicaid Other

## 2021-01-22 ENCOUNTER — Ambulatory Visit (HOSPITAL_COMMUNITY): Admission: RE | Admit: 2021-01-22 | Payer: Medicaid Other | Source: Ambulatory Visit

## 2021-01-29 ENCOUNTER — Ambulatory Visit: Payer: Medicaid Other | Admitting: Adult Health

## 2021-01-31 ENCOUNTER — Encounter: Payer: Self-pay | Admitting: Family Medicine

## 2021-02-09 ENCOUNTER — Ambulatory Visit: Payer: Medicaid Other | Admitting: Adult Health

## 2021-04-11 DIAGNOSIS — M549 Dorsalgia, unspecified: Secondary | ICD-10-CM | POA: Diagnosis not present

## 2021-04-11 DIAGNOSIS — R0902 Hypoxemia: Secondary | ICD-10-CM | POA: Diagnosis not present

## 2021-04-11 DIAGNOSIS — R0689 Other abnormalities of breathing: Secondary | ICD-10-CM | POA: Diagnosis not present

## 2021-04-11 DIAGNOSIS — I10 Essential (primary) hypertension: Secondary | ICD-10-CM | POA: Diagnosis not present

## 2021-04-11 DIAGNOSIS — R001 Bradycardia, unspecified: Secondary | ICD-10-CM | POA: Diagnosis not present

## 2021-04-12 ENCOUNTER — Other Ambulatory Visit: Payer: Self-pay

## 2021-04-12 ENCOUNTER — Encounter: Payer: Self-pay | Admitting: Emergency Medicine

## 2021-04-12 ENCOUNTER — Emergency Department
Admission: EM | Admit: 2021-04-12 | Discharge: 2021-04-12 | Disposition: A | Discharge status: Home / Self Care | Payer: Medicaid Other | Attending: Emergency Medicine | Admitting: Emergency Medicine

## 2021-04-12 ENCOUNTER — Emergency Department
Admission: EM | Admit: 2021-04-12 | Discharge: 2021-04-12 | Disposition: A | Discharge status: Home / Self Care | Payer: Medicaid Other | Source: Home / Self Care | Attending: Emergency Medicine | Admitting: Emergency Medicine

## 2021-04-12 ENCOUNTER — Emergency Department: Payer: Medicaid Other

## 2021-04-12 DIAGNOSIS — Z5321 Procedure and treatment not carried out due to patient leaving prior to being seen by health care provider: Secondary | ICD-10-CM | POA: Insufficient documentation

## 2021-04-12 DIAGNOSIS — G8929 Other chronic pain: Secondary | ICD-10-CM | POA: Insufficient documentation

## 2021-04-12 DIAGNOSIS — F17210 Nicotine dependence, cigarettes, uncomplicated: Secondary | ICD-10-CM | POA: Insufficient documentation

## 2021-04-12 DIAGNOSIS — M545 Low back pain, unspecified: Secondary | ICD-10-CM | POA: Insufficient documentation

## 2021-04-12 MED ORDER — BACLOFEN 10 MG PO TABS: 10.0000 mg | ORAL_TABLET | Freq: Three times a day (TID) | ORAL | 0 refills | Status: AC

## 2021-04-12 MED ORDER — MELOXICAM 15 MG PO TABS: 15.0000 mg | ORAL_TABLET | Freq: Every day | ORAL | 2 refills | Status: AC

## 2021-04-12 MED ORDER — ORPHENADRINE CITRATE 30 MG/ML IJ SOLN
60.0000 mg | Freq: Two times a day (BID) | INTRAMUSCULAR | Status: DC
  Administered 2021-04-12: 60 mg via INTRAMUSCULAR
  Filled 2021-04-12: qty 2

## 2021-04-12 MED ORDER — KETOROLAC TROMETHAMINE 60 MG/2ML IM SOLN
60.0000 mg | Freq: Once | INTRAMUSCULAR | Status: AC
  Administered 2021-04-12: 60 mg via INTRAMUSCULAR
  Filled 2021-04-12: qty 2

## 2021-04-12 NOTE — ED Provider Notes (Signed)
[facility] Emergency Department Provider Note  ____________________________________________   Event Date/Time   First MD Initiated Contact with Patient 04/12/21 1257     (approximate)  I have reviewed the triage vital signs and the nursing notes.   HISTORY  Chief Complaint Back Pain    HPI Joanna Higgins is a 20 y.o. female presents emergency department with acute exasperation of chronic back pain.  Patient has scoliosis.  States she came by EMS last night and does not understand why they put her in the lobby.  States she had back pain.  She left without being seen last night and returned today by EMS for back pain.  States that she is hardly been able to move.  Is going to make an appointment with her spine specialist.  She is also concerned that her blood pressure was greatly elevated today.  She does not denies any numbness or tingling, no loss of bowel or bladder control.   Past Medical History:  Diagnosis Date  . Bladder instability   . Bulging lumbar disc   . DIC (disseminated intravascular coagulation) (HCC)   . DVT (deep venous thrombosis) (HCC)    2016  . History of kidney stones   . Irregular menstrual bleeding 02/10/2016  . Scoliosis   . Vascular malformation     Patient Active Problem List   Diagnosis Date Noted  . Missed periods 01/14/2021  . Generalized abdominal pain 01/14/2021  . Pregnancy examination or test, negative result 01/14/2021  . Vaginal discharge 01/14/2021  . Otalgia 10/31/2020  . Non-recurrent acute suppurative otitis media of left ear without spontaneous rupture of tympanic membrane 10/24/2020  . Sore throat 10/24/2020  . Chronic insomnia 06/18/2020  . Constipation 04/25/2020  . Pain of upper abdomen 04/25/2020  . Acute cystitis with hematuria 03/31/2020  . Nonintractable episodic headache 03/31/2020  . Calculus of gallbladder without cholecystitis without obstruction   . Screening examination for STD (sexually  transmitted disease) 04/18/2019  . Periumbilical abdominal pain 04/18/2019  . Irregular menstrual bleeding 02/10/2016  . Learning disability 08/22/2015  . ADD (attention deficit disorder) 08/22/2015  . 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 DUB (dysfunctional uterine bleeding) 12/03/2013  . Anemia 12/03/2013  . Scoliosis 04/28/2013  . Adiposity 10/19/2012  . 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 CHOLECYSTECTOMY N/A 02/13/2020   Procedure: LAPAROSCOPIC CHOLECYSTECTOMY;  Surgeon: Franky Macho, MD;  Location: [facility];  Service: General;  Laterality: N/A;  . UPPER ENDOSCOPY W/ SCLEROTHERAPY     ?patient denies    Prior to Admission medications   Medication Sig Start Date End Date Taking? Authorizing Provider  baclofen (LIORESAL) 10 MG tablet Take 1 tablet (10 mg total) by mouth 3 (three) times daily for 7 days. 04/12/21 04/19/21 Yes Ceniya Fowers, Roselyn Bering, PA-C  meloxicam (MOBIC) 15 MG tablet Take 1 tablet (15 mg total) by mouth daily. 04/12/21 04/12/22 Yes Haizley Cannella, Roselyn Bering, PA-C    Allergies Estrogens and Hydrocodone  Family History  Problem Relation Age of Onset  . Arthritis Mother   . Arthritis Father   . Arthritis Maternal Grandmother   . Diabetes Maternal Grandfather   . Arthritis Maternal Grandfather   . Arthritis Paternal Grandfather   . Colon cancer Neg Hx   . Pancreatic cancer Neg Hx     Social History Social History   Tobacco Use  . Smoking status: Current Every Day  Smoker    Packs/day: 1.00    Years: 3.00    Pack years: 3.00    Types: E-cigarettes, Cigarettes  . Smokeless tobacco: Never Used  Vaping Use  . Vaping Use: Former  . Quit date: 02/10/2019  Substance Use Topics  . Alcohol use: No    Alcohol/week: 0.0 standard drinks  . Drug use: No    Review of Systems  Constitutional: No fever/chills Eyes: No visual changes. ENT: No  sore throat. Respiratory: Denies cough Cardiovascular: Denies chest pain Gastrointestinal: Denies abdominal pain Genitourinary: Negative for dysuria. Musculoskeletal: Positive for back pain. Skin: Negative for rash. Psychiatric: no mood changes,     ____________________________________________   PHYSICAL EXAM:  VITAL SIGNS: ED Triage Vitals  Enc Vitals Group     BP 04/12/21 1230 113/84     Pulse Rate 04/12/21 1230 99     Resp 04/12/21 1230 20     Temp 04/12/21 1230 98.4 F (36.9 C)     Temp Source 04/12/21 1230 Oral     SpO2 04/12/21 1230 99 %     Weight 04/12/21 1204 196 lb (88.9 kg)     Height 04/12/21 1204 5\' 3"  (1.6 m)     Head Circumference --      Peak Flow --      Pain Score 04/12/21 1204 10     Pain Loc --      Pain Edu? --      Excl. in GC? --     Constitutional: Alert and oriented. Well appearing and in no acute distress. Eyes: Conjunctivae are normal.  Head: Atraumatic. Nose: No congestion/rhinnorhea. Mouth/Throat: Mucous membranes are moist.   Neck:  supple no lymphadenopathy noted Cardiovascular: Normal rate, regular rhythm. Heart sounds are normal Respiratory: Normal respiratory effort.  No retractions, lungs c t a  GU: deferred Musculoskeletal: FROM all extremities, warm and well perfused, lumbar spines minimally tender, scoliosis felt along the mid spine, patient has a vascular birthmark noted along the right lower back which is tender to palpation Neurologic:  Normal speech and language.  Skin:  Skin is warm, dry and intact. No rash noted. Psychiatric: Mood and affect are normal. Speech and behavior are normal.  ____________________________________________   LABS (all labs ordered are listed, but only abnormal results are displayed)  Labs Reviewed  URINALYSIS, COMPLETE (UACMP) WITH MICROSCOPIC  POC URINE PREG, ED    ____________________________________________   ____________________________________________  RADIOLOGY    ____________________________________________   PROCEDURES  Procedure(s) performed: Toradol and Norflex given IM   Procedures    ____________________________________________   INITIAL IMPRESSION / ASSESSMENT AND PLAN / ED COURSE  Pertinent labs & imaging results that were available during my care of the patient were reviewed by me and considered in my medical decision making (see chart for details).   Patient is a 20 year old female presents with cute onset of chronic back pain.  See HPI.  Physical exam shows patient per stable at this time we will give patient pain medication if this does not help we will perform imaging.   Patient states she had some relief with the medication.  She just wants to go home at this time.  Follow-up with her regular doctor later this week.  She was given a prescription for meloxicam and baclofen.  She was discharged in stable condition.  Joanna Higgins was evaluated in Emergency Department on 04/12/2021 for the symptoms described in the history of present illness. She was evaluated in the context of the global  COVID-19 pandemic, which necessitated consideration that the patient might be at risk for infection with the SARS-CoV-2 virus that causes COVID-19. Institutional protocols and algorithms that pertain to the evaluation of patients at risk for COVID-19 are in a state of rapid change based on information released by regulatory bodies including the CDC and federal and state organizations. These policies and algorithms were followed during the patient's care in the ED.    As part of my medical decision making, I reviewed the following data within the electronic MEDICAL RECORD NUMBER Nursing notes reviewed and incorporated, Old chart reviewed, Notes from prior ED visits and [place] Controlled Substance  Database  ____________________________________________   FINAL CLINICAL IMPRESSION(S) / ED DIAGNOSES  Final diagnoses:  Acute exacerbation of chronic low back pain      NEW MEDICATIONS STARTED DURING THIS VISIT:  Discharge Medication List as of 04/12/2021  2:42 PM    START taking these medications   Details  baclofen (LIORESAL) 10 MG tablet Take 1 tablet (10 mg total) by mouth 3 (three) times daily for 7 days., Starting Sun 04/12/2021, Until Sun 04/19/2021, Print    meloxicam (MOBIC) 15 MG tablet Take 1 tablet (15 mg total) by mouth daily., Starting Sun 04/12/2021, Until Mon 04/12/2022, Print         Note:  This document was prepared using Dragon voice recognition software and may include unintentional dictation errors.    Faythe Ghee, PA-C 04/12/21 1536    Gilles Chiquito, MD 04/12/21 [phone]

## 2021-04-12 NOTE — Discharge Instructions (Addendum)
Follow-up with your regular doctor.  Please call for an appointment.  Return emergency department worsening. Your blood pressure was normal today.

## 2021-04-12 NOTE — ED Triage Notes (Signed)
Patient from home via [facility] with complaints of acute on chronic lower back pain that started approx 0010, pt denies trauma to back, CNS intact to extremities  History of scoliosis, and pinched nerve  Pt denies meds taken for pain

## 2021-04-12 NOTE — ED Notes (Signed)
Patient to triage via wheelchair by EMS from home.  Per EMS patient with lower back pain that started approximately an hour prior to arrival.  Patient with history of same with history of herniated disc and scolosis.  EMS intervention -- p 120, bp 172/100, pulse oxi 94% on room air, cbg 177.

## 2021-04-12 NOTE — ED Triage Notes (Signed)
Pt c/o lower back pain since yesterday, states she has a hx of chronic back issues. States she came here last night but LWBS

## 2021-04-14 DIAGNOSIS — M545 Low back pain, unspecified: Secondary | ICD-10-CM | POA: Diagnosis not present

## 2021-04-21 ENCOUNTER — Other Ambulatory Visit (HOSPITAL_COMMUNITY): Payer: Self-pay | Admitting: Orthopedic Surgery

## 2021-04-21 DIAGNOSIS — M545 Low back pain, unspecified: Secondary | ICD-10-CM

## 2021-05-05 ENCOUNTER — Ambulatory Visit (HOSPITAL_COMMUNITY): Payer: Medicaid Other

## 2021-05-05 ENCOUNTER — Encounter (HOSPITAL_COMMUNITY): Payer: Self-pay

## 2021-07-26 DIAGNOSIS — K5900 Constipation, unspecified: Secondary | ICD-10-CM | POA: Diagnosis not present

## 2021-11-25 ENCOUNTER — Ambulatory Visit: Payer: Medicaid Other | Admitting: Nurse Practitioner

## 2021-12-08 ENCOUNTER — Ambulatory Visit: Payer: Medicaid Other | Admitting: Adult Health

## 2022-04-05 ENCOUNTER — Emergency Department: Payer: Medicaid Other

## 2022-04-05 ENCOUNTER — Emergency Department
Admission: EM | Admit: 2022-04-05 | Discharge: 2022-04-05 | Disposition: A | Discharge status: Home / Self Care | Payer: Medicaid Other | Attending: Emergency Medicine | Admitting: Emergency Medicine

## 2022-04-05 ENCOUNTER — Other Ambulatory Visit: Payer: Self-pay

## 2022-04-05 DIAGNOSIS — W010XXA Fall on same level from slipping, tripping and stumbling without subsequent striking against object, initial encounter: Secondary | ICD-10-CM | POA: Diagnosis not present

## 2022-04-05 DIAGNOSIS — W19XXXA Unspecified fall, initial encounter: Secondary | ICD-10-CM

## 2022-04-05 DIAGNOSIS — S93401A Sprain of unspecified ligament of right ankle, initial encounter: Secondary | ICD-10-CM | POA: Insufficient documentation

## 2022-04-05 DIAGNOSIS — S99911A Unspecified injury of right ankle, initial encounter: Secondary | ICD-10-CM | POA: Diagnosis not present

## 2022-04-05 MED ORDER — ONDANSETRON 4 MG PO TBDP
4.0000 mg | ORAL_TABLET | Freq: Once | ORAL | Status: AC
  Administered 2022-04-05: 4 mg via ORAL
  Filled 2022-04-05: qty 1

## 2022-04-05 MED ORDER — OXYCODONE HCL 5 MG PO TABS
10.0000 mg | ORAL_TABLET | Freq: Once | ORAL | Status: AC
  Administered 2022-04-05: 10 mg via ORAL
  Filled 2022-04-05: qty 2

## 2022-04-05 MED ORDER — OXYCODONE-ACETAMINOPHEN 5-325 MG PO TABS: 1.0000 | ORAL_TABLET | Freq: Four times a day (QID) | ORAL | 0 refills | Status: DC | PRN

## 2022-04-05 NOTE — Discharge Instructions (Addendum)
-  Treat pain with Tylenol/ibuprofen as needed.  Use oxycodone sparingly. ?-Rest and ice the affected area for the first 48 hours.  Return to normal activity as tolerated. ?-Follow-up with the orthopedist if your pain fails to improve after a few weeks. ?-Return to the emergency department anytime if you begin to experience any new or worsening symptoms. ?

## 2022-04-05 NOTE — ED Provider Notes (Signed)
? ?[facility] ?Provider Note ? ? ? Event Date/Time  ? First MD Initiated Contact with Patient 04/05/22 1917   ?  (approximate) ? ? ?History  ? ?Chief Complaint ?Fall ? ? ?HPI ?Joanna Higgins is a 21 y.o. female, history of ADD, DIC, dysfunctional uterine bleeding, presents to the emergency department for evaluation of right ankle/foot pain.  Patient states she was getting into her car when she accidentally slipped and rolled her right ankle.  This event occurred earlier today.  She states that since then, she has been unable to walk on it without significant pain.  Denies any preceding symptoms before the fall.  Denies head injury or LOC.  Denies leg pain, knee pain, thigh pain, or hip pain.  No fever or chills. ? ?History Limitations: No limitations. ? ?    ? ? ?Physical Exam  ?Triage Vital Signs: ?ED Triage Vitals  ?Enc Vitals Group  ?   BP 04/05/22 1805 113/60  ?   Pulse Rate 04/05/22 1805 (!) 107  ?   Resp 04/05/22 1805 18  ?   Temp 04/05/22 1805 98.1 ?F (36.7 ?C)  ?   Temp Source 04/05/22 1805 Oral  ?   SpO2 04/05/22 1805 97 %  ?   Weight 04/05/22 1804 196 lb (88.9 kg)  ?   Height 04/05/22 1804 5\' 3"  (1.6 m)  ?   Head Circumference --   ?   Peak Flow --   ?   Pain Score 04/05/22 1803 10  ?   Pain Loc --   ?   Pain Edu? --   ?   Excl. in GC? --   ? ? ?Most recent vital signs: ?Vitals:  ? 04/05/22 1805  ?BP: 113/60  ?Pulse: (!) 107  ?Resp: 18  ?Temp: 98.1 ?F (36.7 ?C)  ?SpO2: 97%  ? ? ?General: Awake, NAD.  ?Skin: Warm, dry. No rashes or lesions.  ?Eyes: PERRL. Conjunctivae normal.  ?ENT: Throat clear, no erythema or exudates. Uvula midline.  ?Neck: Normal ROM. No nuchal rigidity.  ?CV: Good peripheral perfusion.  ?Resp: Normal effort.  ?Abd: Soft, non-tender. No distention.  ?Neuro: At baseline. No gross neurological deficits.  ?MSK: No gross deformities. Normal ROM in all extremities.  ? ?Other: Contusion appreciated along the fifth metatarsal with notable swelling.  Patient maintains  normal flexion extension of the foot.  No bony tenderness along the tibia/fibula.  Pulse, motor, sensation intact distally.  No bleeding or discharge present.  No warmth or erythema. ? ?Physical Exam ? ? ? ?ED Results / Procedures / Treatments  ?Labs ?(all labs ordered are listed, but only abnormal results are displayed) ?Labs Reviewed - No data to display ? ? ?EKG ?Not applicable. ? ? ?RADIOLOGY ? ?ED Provider Interpretation: I personally reviewed and interpreted this x-ray, no evidence of acute fracture or dislocation. ? ?DG Ankle Complete Right ? ?Result Date: 04/05/2022 ?CLINICAL DATA:  Right lateral ankle injury. EXAM: RIGHT ANKLE - COMPLETE 3+ VIEW COMPARISON:  None. FINDINGS: No acute fracture or dislocation identified. On the lateral view irregularity of the anterior distal tibia has the appearance of old injury. The ankle mortise shows normal alignment. No bony lesions. IMPRESSION: No acute fracture identified. Irregularity of the anterior distal tibia on the lateral view has the appearance probable prior injury. Electronically Signed   By: 06/05/2022 M.D.   On: 04/05/2022 18:40  ? ?DG Foot Complete Right ? ?Result Date: 04/05/2022 ?CLINICAL DATA:  Injury to right  foot and ankle. EXAM: RIGHT FOOT COMPLETE - 3+ VIEW COMPARISON:  None. FINDINGS: There is no evidence of fracture or dislocation. There is no evidence of arthropathy or other focal bone abnormality. Soft tissues are unremarkable. IMPRESSION: Negative. Electronically Signed   By: Irish Lack M.D.   On: 04/05/2022 18:41   ? ?PROCEDURES: ? ?Critical Care performed: None. ? ?Procedures ? ? ? ?MEDICATIONS ORDERED IN ED: ?Medications  ?oxyCODONE (Oxy IR/ROXICODONE) immediate release tablet 10 mg (10 mg Oral Given 04/05/22 1809)  ?ondansetron (ZOFRAN-ODT) disintegrating tablet 4 mg (4 mg Oral Given 04/05/22 1809)  ? ? ? ?IMPRESSION / MDM / ASSESSMENT AND PLAN / ED COURSE  ?I reviewed the triage vital signs and the nursing notes. ?             ?                ? ?Differential diagnosis includes, but is not limited to, malleolus fracture, tibia fracture, fibula fracture, tarsal fracture, metatarsal fracture, tendon injury. ? ?ED Course ?Patient appears well.  Vital signs within normal limits.  NAD. ? ?X-ray shows no evidence of acute fracture or dislocation.  See above for incidental findings. ? ?Assessment/Plan ?Presentation consistent with ankle sprain.  X-ray reassuring for no evidence of a fracture or dislocation.  There is a small irregularity noted on the distal anterior tibia, though patient is not endorsing any pain at this location.  Likely prior injury.  No evidence of significant soft tissue damage based on my physical exam.  Will provide patient with ankle brace and crutches.  Encouraged her to take Tylenol/ibuprofen as needed for pain.  We will provide her with a short-term prescription for oxycodone to be utilized as needed if conservative measures fail.  Advised her to follow-up with her orthopedist if her symptoms fail to improve after a few weeks.  We will plan to discharge. ? ?Patient was provided with anticipatory guidance, return precautions, and educational material. Encouraged the patient to return to the emergency department at any time if they begin to experience any new or worsening symptoms. Patient expressed understanding and agreed with the plan. ? ?  ? ? ?FINAL CLINICAL IMPRESSION(S) / ED DIAGNOSES  ? ?Final diagnoses:  ?Fall, initial encounter  ?Sprain of right ankle, unspecified ligament, initial encounter  ? ? ? ?Rx / DC Orders  ? ?ED Discharge Orders   ? ?      Ordered  ?  oxyCODONE-acetaminophen (PERCOCET/ROXICET) 5-325 MG tablet  Every 6 hours PRN       ? 04/05/22 1945  ? ?  ?  ? ?  ? ? ? ?Note:  This document was prepared using Dragon voice recognition software and may include unintentional dictation errors. ?  ?Varney Daily, [place] ?04/06/22 6295 ? ?  ?Georga Hacking, MD ?04/09/22 [phone] ? ?

## 2022-04-05 NOTE — ED Triage Notes (Signed)
Pt to ED via POV from home. Pt was getting into the car and slipped. Pt with injury to right ankle and abrasion on right lateral side. DP pulse strong.  ?

## 2022-04-05 NOTE — ED Provider Triage Note (Signed)
?  Emergency Medicine Provider Triage Evaluation Note ? ?Joanna Higgins , a 21 y.o.female,  was evaluated in triage.  Pt complains of ankle/foot pain after falling while getting into car.  ? ? ?Review of Systems  ?Positive: Foot/ankle pain ?Negative: Denies fever, chest pain, vomiting ? ?Physical Exam  ?There were no vitals filed for this visit. ?Gen:   Awake, anxious  ?Resp:  Normal effort  ?MSK:   Moves extremities without difficulty  ?Other:  Diffuse swelling on right foot/ankle. Contusion noted. No gross deformities.  ? ?Medical Decision Making  ?Given the patient's initial medical screening exam, the following diagnostic evaluation has been ordered. The patient will be placed in the appropriate treatment space, once one is available, to complete the evaluation and treatment. I have discussed the plan of care with the patient and I have advised the patient that an ED physician or mid-level practitioner will reevaluate their condition after the test results have been received, as the results may give them additional insight into the type of treatment they may need.  ? ? ?Diagnostics: x ray ? ?Treatments: oxycodone ?  ?Varney Daily, PA ?04/05/22 1810 ? ?

## 2022-04-06 ENCOUNTER — Telehealth: Payer: Self-pay

## 2022-04-06 MED ORDER — OXYCODONE-ACETAMINOPHEN 5-325 MG PO TABS: 1.0000 | ORAL_TABLET | ORAL | 0 refills | Status: DC | PRN

## 2022-04-06 NOTE — Telephone Encounter (Signed)
Transition Care Management Unsuccessful Follow-up Telephone Call ? ?Date of discharge and from where:  04/05/2022-[facility] ? ?Attempts:  1st Attempt ? ?Reason for unsuccessful TCM follow-up call:  Left voice message ? ?  ?

## 2022-04-07 ENCOUNTER — Other Ambulatory Visit: Payer: Self-pay

## 2022-04-07 ENCOUNTER — Encounter: Payer: Self-pay | Admitting: Emergency Medicine

## 2022-04-07 ENCOUNTER — Emergency Department: Payer: Medicaid Other

## 2022-04-07 DIAGNOSIS — M7989 Other specified soft tissue disorders: Secondary | ICD-10-CM | POA: Insufficient documentation

## 2022-04-07 DIAGNOSIS — S93601A Unspecified sprain of right foot, initial encounter: Secondary | ICD-10-CM | POA: Diagnosis not present

## 2022-04-07 DIAGNOSIS — S93401A Sprain of unspecified ligament of right ankle, initial encounter: Secondary | ICD-10-CM | POA: Diagnosis not present

## 2022-04-07 DIAGNOSIS — R202 Paresthesia of skin: Secondary | ICD-10-CM | POA: Diagnosis not present

## 2022-04-07 DIAGNOSIS — R200 Anesthesia of skin: Secondary | ICD-10-CM | POA: Diagnosis not present

## 2022-04-07 NOTE — Telephone Encounter (Signed)
Transition Care Management Follow-up Telephone Call ?Date of discharge and from where: 04/05/2022-[facility] ?How have you been since you were released from the hospital? Pt stated she is doing fine.  ?Any questions or concerns? No ? ?Items Reviewed: ?Did the pt receive and understand the discharge instructions provided? Yes  ?Medications obtained and verified? Yes  ?Other? No  ?Any new allergies since your discharge? No  ?Dietary orders reviewed? No ?Do you have support at home? Yes  ? ?Home Care and Equipment/Supplies: ?Were home health services ordered? not applicable ?If so, what is the name of the agency? N/A  ?Has the agency set up a time to come to the patient's home? not applicable ?Were any new equipment or medical supplies ordered?  No ?What is the name of the medical supply agency? N/A ?Were you able to get the supplies/equipment? not applicable ?Do you have any questions related to the use of the equipment or supplies? No ? ?Functional Questionnaire: (I = Independent and D = Dependent) ?ADLs: I ? ?Bathing/Dressing- I ? ?Meal Prep- I ? ?Eating- I ? ?Maintaining continence- I ? ?Transferring/Ambulation- I ? ?Managing Meds- I ? ?Follow up appointments reviewed: ? ?PCP Hospital f/u appt confirmed? No   ?Specialist Hospital f/u appt confirmed? No   ?Are transportation arrangements needed? No  ?If their condition worsens, is the pt aware to call PCP or go to the Emergency Dept.? Yes ?Was the patient provided with contact information for the PCP's office or ED? Yes ?Was to pt encouraged to call back with questions or concerns? Yes  ?

## 2022-04-07 NOTE — ED Triage Notes (Signed)
Pt to ED from home c/o right foot numbness.  States fell off of curb on Monday and seen here for it.  Came back d/t numbness and cold feeling.  Foot warm to touch, weak pedal pulse, bruised.  Pt states hx of blood clots.  States hx of pinched nerve and scoliosis.  Cap refill < 3 seoncds, (+) toe movement, no obvious deformity. ?

## 2022-04-08 ENCOUNTER — Emergency Department
Admission: EM | Admit: 2022-04-08 | Discharge: 2022-04-08 | Disposition: A | Discharge status: Home / Self Care | Payer: Medicaid Other | Attending: Emergency Medicine | Admitting: Emergency Medicine

## 2022-04-08 DIAGNOSIS — R200 Anesthesia of skin: Secondary | ICD-10-CM

## 2022-04-08 NOTE — ED Provider Notes (Signed)
? ?[facility] ?Provider Note ? ? Event Date/Time  ? First MD Initiated Contact with Patient 04/08/22 0229   ?  (approximate) ?History  ?Foot Pain ? ?HPI ?Joanna Higgins is a 21 y.o. female recently diagnosed with a sprained right ankle 3 days prior to arrival who presents complaining of numbness to the sole and first/second toe of this right foot that began last night and has been stable since onset.  Patient states that the symptoms began to worsen as the swelling in her foot worsened over the last 24 hours.  Patient has been attempting elevation, ice, and heat.  Patient does state that elevation of this foot improves symptoms somewhat as well as heat.  Patient denies any blue or white discoloration at the toes.  Patient does endorse a cold sensation but denies her toes feeling cold to the touch.  Patient states she is concerned as she has had a history of blood clots ?Physical Exam  ?Triage Vital Signs: ?ED Triage Vitals  ?Enc Vitals Group  ?   BP 04/07/22 2217 108/89  ?   Pulse Rate 04/07/22 2217 98  ?   Resp 04/07/22 2217 18  ?   Temp 04/07/22 2217 99.4 ?F (37.4 ?C)  ?   Temp Source 04/07/22 2217 Oral  ?   SpO2 04/07/22 2217 96 %  ?   Weight 04/07/22 2222 196 lb (88.9 kg)  ?   Height 04/07/22 2222 5\' 3"  (1.6 m)  ?   Head Circumference --   ?   Peak Flow --   ?   Pain Score 04/07/22 2222 9  ?   Pain Loc --   ?   Pain Edu? --   ?   Excl. in GC? --   ? ?Most recent vital signs: ?Vitals:  ? 04/07/22 2217  ?BP: 108/89  ?Pulse: 98  ?Resp: 18  ?Temp: 99.4 ?F (37.4 ?C)  ?SpO2: 96%  ? ?General: Awake, oriented x4. ?CV:  Good peripheral perfusion.  ?Resp:  Normal effort.  ?Abd:  No distention.  ?Other:  Overweight young adult Caucasian female laying in bed in no distress.  Dorsum of the right foot with significant edema and ecchymosis as well as decree sensation over the sole of the foot as well as between first and second toe ?ED Results / Procedures / Treatments  ? ?Official radiology  report(s): ?2218 Venous Img Lower Unilateral Right ? ?Result Date: 04/07/2022 ?CLINICAL DATA:  Numbness.  Rule out DVT. EXAM: RIGHT LOWER EXTREMITY VENOUS DOPPLER ULTRASOUND TECHNIQUE: Gray-scale sonography with compression, as well as color and duplex ultrasound, were performed to evaluate the deep venous system(s) from the level of the common femoral vein through the popliteal and proximal calf veins. COMPARISON:  Right lower extremity ultrasound 06/22/2018 FINDINGS: VENOUS Normal compressibility of the common femoral, superficial femoral, and popliteal veins, as well as the visualized calf veins. Visualized portions of profunda femoral vein and great saphenous vein unremarkable. No filling defects to suggest DVT on grayscale or color Doppler imaging. Doppler waveforms show normal direction of venous flow, normal respiratory plasticity and response to augmentation. Limited views of the contralateral common femoral vein are unremarkable. OTHER None. Limitations: none IMPRESSION: No evidence of right lower extremity DVT. Electronically Signed   By: 06/24/2018 M.D.   On: 04/07/2022 23:32   ?PROCEDURES: ?Critical Care performed: No ?Procedures ?MEDICATIONS ORDERED IN ED: ?Medications - No data to display ?IMPRESSION / MDM / ASSESSMENT AND PLAN / ED COURSE  ?  I reviewed the triage vital signs and the nursing notes. ?             ?               ?Presents with sensation of numbness to right foot. ? ?Patient?s symptoms and work-up not consistent with a significant nerve injury and therefore they will be discharged from the ED.  Patient has currently been stabilized in the emergency department. ? ?Patient received a Doppler ultrasound of the right lower extremity that did not show any evidence of clots ? ?Patient?s symptoms not typical for other emergent causes such as dissection, infection, DKA, further trauma.  Patient likely has some degree of nerve impingement given the amount of swelling over the dorsum of the  foot. patient will be discharged with strict return precautions and follow up with primary MD within 24 hours for further evaluation. ? ?  ?FINAL CLINICAL IMPRESSION(S) / ED DIAGNOSES  ? ?Final diagnoses:  ?Numbness of right foot  ? ?Rx / DC Orders  ? ?ED Discharge Orders   ? ? None  ? ?  ? ?Note:  This document was prepared using Dragon voice recognition software and may include unintentional dictation errors. ?  ?Merwyn Katos, MD ?04/08/22 [phone] ? ?

## 2022-04-08 NOTE — ED Notes (Signed)
Pt refused discharge vitals at this time. Pt's right ankle wrapped with an ace bandage at this time. ?

## 2022-04-09 ENCOUNTER — Telehealth: Payer: Self-pay

## 2022-04-09 NOTE — Telephone Encounter (Signed)
Transition Care Management Follow-up Telephone Call ?Date of discharge and from where: 04/08/2022-[facility] ?How have you been since you were released from the hospital? Pt stated she is doing fine.  ?Any questions or concerns? No ? ?Items Reviewed: ?Did the pt receive and understand the discharge instructions provided? Yes  ?Medications obtained and verified? Yes  ?Other? No  ?Any new allergies since your discharge? No  ?Dietary orders reviewed? No ?Do you have support at home? Yes  ? ?Home Care and Equipment/Supplies: ?Were home health services ordered? not applicable ?If so, what is the name of the agency? N/A  ?Has the agency set up a time to come to the patient's home? not applicable ?Were any new equipment or medical supplies ordered?  No ?What is the name of the medical supply agency? N/A ?Were you able to get the supplies/equipment? not applicable ?Do you have any questions related to the use of the equipment or supplies? No ? ?Functional Questionnaire: (I = Independent and D = Dependent) ?ADLs: I ? ?Bathing/Dressing- I ? ?Meal Prep- I ? ?Eating- I ? ?Maintaining continence- I ? ?Transferring/Ambulation- I ? ?Managing Meds- I ? ?Follow up appointments reviewed: ? ?PCP Hospital f/u appt confirmed? No   ?Specialist Hospital f/u appt confirmed? No   ?Are transportation arrangements needed? No  ?If their condition worsens, is the pt aware to call PCP or go to the Emergency Dept.? Yes ?Was the patient provided with contact information for the PCP's office or ED? Yes ?Was to pt encouraged to call back with questions or concerns? Yes  ?

## 2023-02-08 DIAGNOSIS — M545 Low back pain, unspecified: Secondary | ICD-10-CM | POA: Diagnosis not present

## 2023-08-02 NOTE — Progress Notes (Signed)
This encounter was created in error - please disregard.

## 2023-08-24 ENCOUNTER — Ambulatory Visit: Payer: Medicaid Other | Admitting: Family Medicine

## 2023-09-27 DIAGNOSIS — I83893 Varicose veins of bilateral lower extremities with other complications: Secondary | ICD-10-CM | POA: Diagnosis not present

## 2023-09-27 DIAGNOSIS — M5116 Intervertebral disc disorders with radiculopathy, lumbar region: Secondary | ICD-10-CM | POA: Diagnosis not present

## 2023-09-27 DIAGNOSIS — I8393 Asymptomatic varicose veins of bilateral lower extremities: Secondary | ICD-10-CM | POA: Diagnosis not present

## 2023-09-27 DIAGNOSIS — E6609 Other obesity due to excess calories: Secondary | ICD-10-CM | POA: Diagnosis not present

## 2023-09-27 DIAGNOSIS — Q279 Congenital malformation of peripheral vascular system, unspecified: Secondary | ICD-10-CM | POA: Diagnosis not present

## 2023-09-30 ENCOUNTER — Ambulatory Visit: Payer: Medicaid Other | Admitting: Family Medicine

## 2023-11-16 ENCOUNTER — Encounter: Payer: Self-pay | Admitting: Emergency Medicine

## 2023-11-16 ENCOUNTER — Other Ambulatory Visit: Payer: Self-pay

## 2023-11-16 ENCOUNTER — Emergency Department
Admission: EM | Admit: 2023-11-16 | Discharge: 2023-11-16 | Disposition: A | Discharge status: Home / Self Care | Payer: Medicaid Other | Attending: Emergency Medicine | Admitting: Emergency Medicine

## 2023-11-16 ENCOUNTER — Emergency Department: Payer: Medicaid Other

## 2023-11-16 DIAGNOSIS — R079 Chest pain, unspecified: Secondary | ICD-10-CM | POA: Diagnosis present

## 2023-11-16 DIAGNOSIS — R0789 Other chest pain: Secondary | ICD-10-CM | POA: Diagnosis not present

## 2023-11-16 DIAGNOSIS — R091 Pleurisy: Secondary | ICD-10-CM | POA: Diagnosis not present

## 2023-11-16 DIAGNOSIS — I10 Essential (primary) hypertension: Secondary | ICD-10-CM | POA: Diagnosis not present

## 2023-11-16 LAB — URINALYSIS, ROUTINE W REFLEX MICROSCOPIC
Bilirubin Urine: NEGATIVE
Glucose, UA: NEGATIVE mg/dL
Hgb urine dipstick: NEGATIVE
Ketones, ur: NEGATIVE mg/dL
Nitrite: NEGATIVE
Protein, ur: NEGATIVE mg/dL
Specific Gravity, Urine: 1.014 (ref 1.005–1.030)
pH: 7 (ref 5.0–8.0)

## 2023-11-16 LAB — CBC
HCT: 41.4 % (ref 36.0–46.0)
Hemoglobin: 13.5 g/dL (ref 12.0–15.0)
MCH: 29.5 pg (ref 26.0–34.0)
MCHC: 32.6 g/dL (ref 30.0–36.0)
MCV: 90.4 fL (ref 80.0–100.0)
Platelets: 346 10*3/uL (ref 150–400)
RBC: 4.58 MIL/uL (ref 3.87–5.11)
RDW: 14 % (ref 11.5–15.5)
WBC: 12.1 10*3/uL — ABNORMAL HIGH (ref 4.0–10.5)
nRBC: 0 % (ref 0.0–0.2)

## 2023-11-16 LAB — BASIC METABOLIC PANEL
Anion gap: 7 (ref 5–15)
BUN: 16 mg/dL (ref 6–20)
CO2: 22 mmol/L (ref 22–32)
Calcium: 9.3 mg/dL (ref 8.9–10.3)
Chloride: 110 mmol/L (ref 98–111)
Creatinine, Ser: 0.69 mg/dL (ref 0.44–1.00)
GFR, Estimated: >60 mL/min (ref >60)
Glucose, Bld: 106 mg/dL — ABNORMAL HIGH (ref 70–99)
Potassium: 4 mmol/L (ref 3.5–5.1)
Sodium: 139 mmol/L (ref 135–145)

## 2023-11-16 LAB — POC URINE PREG, ED: Preg Test, Ur: NEGATIVE

## 2023-11-16 LAB — D-DIMER, QUANTITATIVE: D-Dimer, Quant: 0.48 ug{FEU}/mL (ref 0.00–0.50)

## 2023-11-16 MED ORDER — KETOROLAC TROMETHAMINE 30 MG/ML IJ SOLN: 30.0000 mg | Freq: Once | INTRAMUSCULAR | Status: DC

## 2023-11-16 MED ORDER — KETOROLAC TROMETHAMINE 15 MG/ML IJ SOLN
30.0000 mg | Freq: Once | INTRAMUSCULAR | Status: DC
  Filled 2023-11-16: qty 2

## 2023-11-16 MED ORDER — KETOROLAC TROMETHAMINE 30 MG/ML IJ SOLN
30.0000 mg | Freq: Once | INTRAMUSCULAR | Status: AC
  Administered 2023-11-16: 30 mg via INTRAMUSCULAR

## 2023-11-16 NOTE — ED Triage Notes (Signed)
Patient to ED via [facility] for left sided flank pain. States hurts ti take a deep breath. Also has been getting hot. Denies urinary difficulties or N/V/D.

## 2023-11-16 NOTE — ED Notes (Signed)
First Nurse Note: Pt to ED via [facility] from home for intermittent right side pain since yesterday. Pt denies injury to the area. Pt reported to EMS that it hurts to take a deep breath and that 2 weeks ago she was diagnosed with a blood clot in her leg and started on Aspirin.  BP 136/101 HR 76 SpO2 97% CBG 105 Temp 97.4

## 2023-11-16 NOTE — Discharge Instructions (Signed)
Please seek medical attention for any high fevers, chest pain, shortness of breath, change in behavior, persistent vomiting, bloody stool or any other new or concerning symptoms.  

## 2023-11-16 NOTE — ED Provider Notes (Signed)
[facility] Provider Note    Event Date/Time   First MD Initiated Contact with Patient 11/16/23 0809     (approximate)   History   Flank Pain   HPI  Joanna Higgins is a 22 y.o. female who presents to the emergency department today because of concerns for shortness of breath and left lower chest pain.  The patient's shortness of breath started last night.  She denies any associated cough.  This morning she developed pain in her left lower chest.  It is worse with deep breaths.  Patient states she was recently seen and started on aspirin for blood clot in her leg.  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     Physical Exam   Triage Vital Signs: ED Triage Vitals  Encounter Vitals Group     BP 11/16/23 0756 (!) 125/95     Systolic BP Percentile --      Diastolic BP Percentile --      Pulse Rate 11/16/23 0756 82     Resp 11/16/23 0756 18     Temp 11/16/23 0756 98.7 F (37.1 C)     Temp Source 11/16/23 0756 Oral     SpO2 11/16/23 0756 99 %     Weight 11/16/23 0754 196 lb (88.9 kg)     Height 11/16/23 0754 5\' 3"  (1.6 m)     Head Circumference --      Peak Flow --      Pain Score 11/16/23 0754 6     Pain Loc --      Pain Education --      Exclude from Growth Chart --     Most recent vital signs: Vitals:   11/16/23 0756  BP: (!) 125/95  Pulse: 82  Resp: 18  Temp: 98.7 F (37.1 C)  SpO2: 99%   General: Awake, alert, oriented. CV:  Good peripheral perfusion. Regular rate and rhythm. Resp:  Normal effort. Lungs clear. Abd:  No distention. Non tender.  ED Results / Procedures / Treatments   Labs (all labs ordered are listed, but only abnormal results are displayed) Labs Reviewed  URINALYSIS, ROUTINE W REFLEX MICROSCOPIC - Abnormal; Notable for the following components:      Result Value   Color, Urine YELLOW (*)     APPearance CLOUDY (*)    Leukocytes,Ua TRACE (*)    Bacteria, UA FEW (*)    All other components within normal limits  BASIC METABOLIC PANEL - Abnormal; Notable for the following components:   Glucose, Bld 106 (*)    All other components within normal limits  CBC - Abnormal; Notable for the following components:   WBC 12.1 (*)    All other components within normal limits  URINE CULTURE  D-DIMER, QUANTITATIVE  POC URINE PREG, ED     EKG  None   RADIOLOGY I independently interpreted and visualized the CXR. My interpretation: No pneumonia Radiology interpretation: IMPRESSION:  *No active cardiopulmonary disease.  *Dextroscoliosis of the thoracic spine.     PROCEDURES:  Critical Care performed: No   MEDICATIONS ORDERED IN ED: Medications - No data to display   IMPRESSION / MDM / ASSESSMENT AND PLAN / ED COURSE  I reviewed the triage vital signs and the nursing notes.  Differential diagnosis includes, but is not limited to, PE, pneumonia, pneumothorax, pleurisy  Patient's presentation is most consistent with acute presentation with potential threat to life or bodily function.   Patient presented to the emergency department today because of concerns for left sided chest pain.  Patient was concerned for possible blood clot.  Per chart review it do not see any clear evidence that she has had a recent blood clot although was recently started on aspirin.  D-dimer here was negative.  Chest x-ray without concerning pneumonia or pneumothorax.  Patient did feel somewhat better after the Toradol.  At this time do wonder if patient is suffering from pleurisy.  I discussed this with the patient.  Will plan on discharging.     FINAL CLINICAL IMPRESSION(S) / ED DIAGNOSES   Final diagnoses:  Left-sided chest pain    Note:  This document was prepared using Dragon voice recognition software and may include unintentional dictation errors.    Phineas Semen, MD 11/16/23 [phone]

## 2023-11-17 LAB — URINE CULTURE

## 2023-11-30 ENCOUNTER — Inpatient Hospital Stay: Payer: Medicaid Other | Admitting: Family Medicine

## 2023-11-30 ENCOUNTER — Ambulatory Visit: Payer: Medicaid Other | Admitting: Physician Assistant

## 2023-11-30 ENCOUNTER — Encounter: Payer: Self-pay | Admitting: Physician Assistant

## 2023-11-30 VITALS — BP 124/76 | Ht 63.0 in | Wt 211.8 lb

## 2023-11-30 DIAGNOSIS — R091 Pleurisy: Secondary | ICD-10-CM | POA: Diagnosis not present

## 2023-11-30 DIAGNOSIS — K5900 Constipation, unspecified: Secondary | ICD-10-CM | POA: Diagnosis not present

## 2023-11-30 NOTE — Progress Notes (Signed)
   Acute Office Visit  Subjective:     Patient ID: Joanna Higgins, female    DOB: [birth year], 22 y.o.   MRN: [id number]   HPI Patient is in today for follow up after ER visit on 11/20. She states symptoms overall are improving but she is still having some discomfort when laying down, as well as an occasional cough with sputum production. She relates sore throat from repetitive coughing. She states she is taking 800mg  ibuprofen one time daily for relief. She also relates 1 week of constipation. She states she has not had a BM in more than 7 days, but when she does use the bathroom her stools are loose. Patient also stating concern for acid reflux as she is occasionally belching and tasting stomach acid. She denies fevers, nausea, diarrhea. She also reports vaping cessation since onset of symptoms around 11/20.   Review of Systems  HENT:  Positive for sore throat.   Respiratory:  Positive for cough.   Gastrointestinal:  Positive for constipation.        Objective:     BP 124/76   Ht 5\' 3"  (1.6 m)   Wt 211 lb 12.8 oz (96.1 kg)   LMP 11/02/2023 (Approximate)   BMI 37.52 kg/m   Physical Exam Constitutional:      Appearance: Normal appearance.  HENT:     Nose: Nose normal.     Mouth/Throat:     Mouth: Mucous membranes are moist.     Pharynx: Oropharynx is clear.  Cardiovascular:     Rate and Rhythm: Normal rate and regular rhythm.  Pulmonary:     Effort: Pulmonary effort is normal.     Breath sounds: Normal breath sounds. No stridor. No wheezing, rhonchi or rales.  Skin:    Capillary Refill: Capillary refill takes less than 2 seconds.  Neurological:     General: No focal deficit present.     Mental Status: She is alert and oriented to person, place, and time.     No results found for any visits on 11/30/23.      Assessment & Plan:  Pleurisy Assessment & Plan: Patient presenting for ER follow up today. Patient advised to increase ibuprofen 800mg  use from once daily  to twice daily to help relieve lingering pain. As lungs are clear to auscultation on exam today, and patient is well appearing without systemic symptoms, infectious etiology unlikely today. Do not see reason to repeat chest XR at this time as patient seems to be improving overall.    Constipation, unspecified constipation type Assessment & Plan: OTC Miralax recommended for constipation. Patient advised to increase fluid intake and incorporate more fiber into her diet.    Patient advised to try Tums or other antacid for acid reflux symptoms.    Return if symptoms worsen or fail to improve.  Toni Amend Tilton Marsalis, PA-C

## 2023-11-30 NOTE — Assessment & Plan Note (Signed)
Patient presenting for ER follow up today. Patient advised to increase ibuprofen 800mg  use from once daily to twice daily to help relieve lingering pain. As lungs are clear to auscultation on exam today, and patient is well appearing without systemic symptoms, infectious etiology unlikely today. Do not see reason to repeat chest XR at this time as patient seems to be improving overall.

## 2023-11-30 NOTE — Assessment & Plan Note (Signed)
OTC Miralax recommended for constipation. Patient advised to increase fluid intake and incorporate more fiber into her diet.

## 2023-11-30 NOTE — Progress Notes (Deleted)
   Established Patient Office Visit  Subjective   Patient ID: Joanna Higgins, female    DOB: 08-02-2001  Age: 22 y.o. MRN: [id number]  Chief Complaint  Patient presents with   ER follow up    Still feels like she has something in lungs- still hurts to take a deep breath -worse when laying down    HPI   ROS    Objective:     BP 124/76   Ht 5\' 3"  (1.6 m)   Wt 211 lb 12.8 oz (96.1 kg)   LMP 11/02/2023 (Approximate)   BMI 37.52 kg/m    Physical Exam   No results found for any visits on 11/30/23.  The ASCVD Risk score (Arnett DK, et al., 2019) failed to calculate for the following reasons:   The 2019 ASCVD risk score is only valid for ages 28 to 63    Assessment & Plan:   No follow-ups on file.   There are no diagnoses linked to this encounter.  Toni Amend Tawona Filsinger, PA-C

## 2024-04-03 DIAGNOSIS — M5116 Intervertebral disc disorders with radiculopathy, lumbar region: Secondary | ICD-10-CM | POA: Diagnosis not present

## 2024-04-17 DIAGNOSIS — M5116 Intervertebral disc disorders with radiculopathy, lumbar region: Secondary | ICD-10-CM | POA: Diagnosis not present

## 2024-05-01 DIAGNOSIS — M5116 Intervertebral disc disorders with radiculopathy, lumbar region: Secondary | ICD-10-CM | POA: Diagnosis not present

## 2024-05-16 DIAGNOSIS — Q279 Congenital malformation of peripheral vascular system, unspecified: Secondary | ICD-10-CM | POA: Diagnosis not present

## 2024-06-08 DIAGNOSIS — I8002 Phlebitis and thrombophlebitis of superficial vessels of left lower extremity: Secondary | ICD-10-CM | POA: Diagnosis not present

## 2024-06-08 DIAGNOSIS — Q279 Congenital malformation of peripheral vascular system, unspecified: Secondary | ICD-10-CM | POA: Diagnosis not present

## 2024-06-08 DIAGNOSIS — M5116 Intervertebral disc disorders with radiculopathy, lumbar region: Secondary | ICD-10-CM | POA: Diagnosis not present

## 2024-06-08 DIAGNOSIS — I83893 Varicose veins of bilateral lower extremities with other complications: Secondary | ICD-10-CM | POA: Diagnosis not present

## 2024-06-08 DIAGNOSIS — M4106 Infantile idiopathic scoliosis, lumbar region: Secondary | ICD-10-CM | POA: Diagnosis not present

## 2024-06-08 NOTE — Progress Notes (Signed)
 Chief Complaint:   Chief Complaint  Patient presents with  . Visit Follow Up    Subjective  Joanna Higgins is a 23 y.o. 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 However she had symptomatic veins on the LLE and I treated her with glycerin on 5/21 of the LLE. She was instructed to wear compression and use prophylactic anticoagulation and she did not do either. Now comes with pain in the calf and complaints of numbness in the leg.  Joanna Higgins is a 23 year old female with vascular malformation who presents with numbness and discomfort in the left leg following sclerotherapy.  She underwent a sclerotherapy procedure on her left leg, after which she began experiencing numbness and cold sensations localized to the back of her leg, not extending to her foot. These symptoms began immediately following the procedure.  She notes the appearance of bruises and the ability to see her fingerprints on her skin, which she describes as unusual. She has a history of vascular malformation, which increases her risk of inflammation following interventions.  She experiences discomfort in the left leg, describing it as 'very uncomfortable' and noting that it worsened before improving.  She has a history of back issues, which may contribute to her symptoms. She has difficulty feeling pressure applied to her legs, although she can feel sensations in her hands and hips. She is able to distinguish between sharp and soft sensations in various parts of her body, though with some difficulty.  She was previously prescribed Eliquis for prophylactic anticoagulation but did not take it because she was told it was not necessary at the time. She finds the stockings uncomfortable and notes they do  not fit properly, as they roll down and fail to provide adequate compression.  Review of Systems  Patient Active Problem List  Diagnosis  . Venous lymphatic malformation (HHS-HCC)  . Venous malformation (HHS-HCC)  . Obesity  . Vascular malformation (HHS-HCC)  . Back pain with right-sided sciatica  . Infantile idiopathic scoliosis of lumbar region  . Lumbar disc herniation with radiculopathy  . Adolescent idiopathic scoliosis of thoracolumbar region  . DIC (disseminated intravascular coagulation) (CMS/HHS-HCC)  . Congenital vascular malformation (HHS-HCC)  . Anemia    Outpatient Medications Prior to Visit  Medication Sig Dispense Refill  . acetaminophen  (TYLENOL ) 325 MG tablet Take 650 mg by mouth every 4 (four) hours as needed for Pain. (Patient not taking: Reported on 09/27/2023)    . docusate (COLACE) 100 MG capsule Take 100 mg by mouth 2 (two) times daily as needed for Constipation. (Patient not taking: Reported on 09/27/2023)    . fluticasone  (FLONASE ) 50 mcg/actuation nasal spray Place into both nostrils Once daily as needed.  (Patient not taking: Reported on 09/27/2023)    . montelukast (SINGULAIR) 10 mg tablet Take 10 mg by mouth nightly as needed.   (Patient not taking: Reported on 09/27/2023)    . omeprazole (PRILOSEC) 10 MG DR capsule Take 10 mg by mouth daily as needed. (Patient not taking: Reported on 09/27/2023)    . apixaban (ELIQUIS) 2.5 mg tablet Take 1 tablet (2.5 mg total) by mouth every 12 (twelve) hours for 14 days 28 tablet 0   No facility-administered medications prior to visit.      Objective  Vitals:   06/08/24  0808  BP: 112/77  Pulse: 82  Temp: 36.7 C (98.1 F)  TempSrc: Oral  Weight: 94.8 kg (209 lb)  Height: 160 cm (5' 3)  PainSc:   7  PainLoc: Leg   Body mass index is 37.02 kg/m.  Home Vitals:     Physical Exam Physical Exam GENERAL: Alert, cooperative, well developed, no acute distress. HEENT: Normocephalic, normal oropharynx, moist  mucous membranes. CHEST: Clear to auscultation bilaterally, no wheezes, rhonchi, or crackles. CARDIOVASCULAR: Normal heart rate and rhythm, S1 and S2 normal without murmurs. Peripheral pulses strong. ABDOMEN: Soft, non-tender, non-distended, without organomegaly, normal bowel sounds. EXTREMITIES: No cyanosis or edema. MUSCULOSKELETAL: Leg lifted without discomfort. Pain in lower leg upon lifting. NEUROLOGICAL: Cranial nerves grossly intact, moves all extremities without gross motor or sensory deficit. Sensory examination reveals some inconsistent areas of numbness. Motor function intact, able to wiggle toes. Able to differentiate between sharp and soft sensations.  Results RADIOLOGY Ultrasound: Superficial thrombosis in small saphenous vein     Assessment/Plan:   Assessment & Plan Superficial vein thrombosis Superficial vein thrombosis in the small saphenous vein of the left leg, likely due to vascular malformation. Non-adherence to prophylactic anticoagulation may have contributed. Not a deep vein thrombosis and not expected to cause significant harm. - Prescribe Eliquis twice daily for two weeks (was a Rx in the system Rx'd same day as sclero) - Order repeat blood work - Order repeat ultrasound in two weeks  Vascular malformation Vascular malformation increases risk of inflammation and complications post-vascular interventions, necessitating careful management and prophylactic measures.  Numbness in leg Numbness in the left leg post-sclerotherapy, inconsistent with expected outcomes. Possible nerve inflammation due to adjacent clotting in the small saphenous vein or anxiety-related sensory changes. Neurological evaluation warranted. Pulses are excellent. - Refer to neurology for evaluation of numbness, has hx of scoliosis  I did have a conversation that if she does not follow recommendations for treatment in the future will not be able to treat her further. Also when she has issues  after intervention she is to let me know ASAP. Diagnoses and all orders for this visit:  Varicose veins of both lower extremities with complications -     apixaban (ELIQUIS) 2.5 mg tablet; Take 1 tablet (2.5 mg total) by mouth every 12 (twelve) hours for 14 days -     Vascular Surgery US  venous duplex left; Future -     Ambulatory Referral to Neurology  Thrombophlebitis of superficial veins of left lower extremity -     Disseminated Intravascular Coagulation (DIC) Screen; Future -     Vascular Surgery US  venous duplex left; Future -     Ambulatory Referral to Neurology  Lumbar disc herniation with radiculopathy  Infantile idiopathic scoliosis of lumbar region   This visit was coded based on medical decision making (MDM).     No follow-ups on file.     Future Appointments     Date/Time Provider Department Center Visit Type   06/22/2024 1:00 PM (Arrive by 12:30 PM) LYMPH-PA [facility] RETURN VISIT   06/22/2024 1:00 PM (Arrive by 12:30 PM) BC US  3 [facility] US  VASCULAR       There are no Patient Instructions on file for this visit.  Attestation Statement:   I personally performed the service, non-incident to. (WP)   TORIBIO JULIANNA KERRY, PA   An after visit summary was provided  for the patient either in written format (printed) or through MyChart.  This note has been created using automated tools and reviewed for accuracy by TORIBIO JULIANNA KERRY.

## 2024-06-22 DIAGNOSIS — M5116 Intervertebral disc disorders with radiculopathy, lumbar region: Secondary | ICD-10-CM | POA: Diagnosis not present

## 2024-06-22 DIAGNOSIS — Q279 Congenital malformation of peripheral vascular system, unspecified: Secondary | ICD-10-CM | POA: Diagnosis not present

## 2024-06-22 DIAGNOSIS — I8002 Phlebitis and thrombophlebitis of superficial vessels of left lower extremity: Secondary | ICD-10-CM | POA: Diagnosis not present

## 2024-06-22 DIAGNOSIS — I83893 Varicose veins of bilateral lower extremities with other complications: Secondary | ICD-10-CM | POA: Diagnosis not present

## 2024-06-27 NOTE — Progress Notes (Signed)
 Vascular Patient:  Chief Complaint: F/U SVT LLE  History of Present Illness: Joanna Higgins 23 y.o. 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 Had not been in compression nor used prophylactic anticoagulation. Now s/p anticoagulation with resolution of symptoms and US  shows stability without worsening. Last D Dimer was normal. She presents today to Vascular Surgery clinic for f/u.  Allergies  Allergen Reactions  . 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 Hydrocodone  Nausea And Vomiting    Current Outpatient Medications (Other)  Medication Sig Dispense Refill  . apixaban (ELIQUIS) 2.5 mg tablet Take 1 tablet (2.5 mg total) by mouth every 12 (twelve) hours for 14 days 28 tablet 0  . acetaminophen  (TYLENOL ) 325 MG tablet Take 650 mg by mouth every 4 (four) hours as needed for Pain. (Patient not taking: Reported on 06/22/2024)    . docusate (COLACE) 100 MG capsule Take 100 mg by mouth 2 (two) times daily as needed for Constipation. (Patient not taking: Reported on 06/22/2024)    . fluticasone  (FLONASE ) 50 mcg/actuation nasal spray Place into both nostrils Once daily as needed.  (Patient not taking: Reported on 06/22/2024)    . montelukast (SINGULAIR) 10 mg tablet Take 10 mg by mouth nightly as needed.   (Patient not taking: Reported on 06/22/2024)    . omeprazole (PRILOSEC) 10 MG DR capsule Take 10 mg by mouth daily as needed. (Patient not taking: Reported on 06/22/2024)      ROS: 14 systems reviewed pertinent positives and negatives as noted in the HPI.  Past Medical History:  Diagnosis Date  . Aftercare, orthodontic    top and bottom retainer  . Constipation   . GERD (gastroesophageal reflux disease)   . History of anesthesia reaction    Of note for anesthesia she does wake up a little more anxious upon last treatment she pulled her IV and required some anxiolytics.  Will plan on also sending the patient home with  some Ativan or Valium with some pain medication following her next treatment.  . Scab    over area from 4/24 sclerotherapy  . 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 SCLEROTHERAPY VEIN ABDOMEN Right 10/19/2012   Procedure: SCLEROTHERAPY VEIN ABDOMEN/ RIGHT FLANK VENOUS MALFORMATION ;  Surgeon: Montie Leeroy Shellia Curvin, MD;  Location: [facility] OR;  Service: General Surgery;  Laterality: Right;  . SCLEROTHERAPY VEIN LEG Right 04/19/2013   Procedure: RIGHT FLANK MALFORMATION SCLEROTHERAPY;  Surgeon: Montie Leeroy Shellia Curvin, MD;  Location: [facility] OR;  Service: Vascular Surgery;  Laterality: Right;  . SCLEROTHERAPY VEIN LEG Right 05/17/2013   Procedure: RIGHT FLANK MALFORMATION SCLEROTHERAPY;  Surgeon: Merilee Lemond Free, MD;  Location: [facility] OR;  Service: General Surgery;  Laterality: Right;  . SCLEROTHERAPY VEIN LEG Right 09/09/2016   Procedure: INJECTION OF SCLEROSING SOLUTION; MULTIPLE VEINS,  Flank;  Surgeon: Montie Leeroy Shellia Curvin, MD;  Location: [facility] OR;  Service: General Surgery;  Laterality: Right;    Social History   Socioeconomic History  . Marital status: Single  Occupational History  . Occupation: 11TH grader  Tobacco Use  . Smoking status: Never  . Smokeless tobacco: Never  Vaping Use  . Vaping status: Never Used  Substance and Sexual Activity  . Alcohol use: No  . Drug use: No  . Sexual activity: Defer   Social Drivers of Health   Financial  Resource Strain: Low Risk  (05/16/2024)   Overall Financial Resource Strain (CARDIA)   . Difficulty of Paying Living Expenses: Not hard at all  Food Insecurity: No Food Insecurity (05/16/2024)   Hunger Vital Sign   . Worried About Programme Researcher, Broadcasting/film/video in the Last Year: Never true   . Ran Out of Food in the Last Year: Never true  Transportation Needs: No Transportation Needs (05/16/2024)   PRAPARE - Transportation   . Lack of Transportation  (Medical): No   . Lack of Transportation (Non-Medical): No  Physical Activity: Inactive (04/10/2020)   Received from [facility]   Exercise Vital Sign   . On average, how many days per week do you engage in moderate to strenuous exercise (like a brisk walk)?: 0 days   . On average, how many minutes do you engage in exercise at this level?: 0 min  Stress: No Stress Concern Present (04/10/2020)   Received from [facility] - Occupational Stress Questionnaire   . Feeling of Stress : Not at all  Social Connections: Unknown (04/10/2020)   Received from [facility]   Social Connection and Isolation Panel   . In a typical week, how many times do you talk on the phone with family, friends, or neighbors?: Never   . How often do you get together with friends or relatives?: Never   . How often do you attend church or religious services?: Never   . Do you belong to any clubs or organizations such as church groups, unions, fraternal or athletic groups, or school groups?: No   . How often do you attend meetings of the clubs or organizations you belong to?: Never   . Are you married, widowed, divorced, separated, never married, or living with a partner?: Patient declined  Housing Stability: Low Risk  (05/16/2024)   Housing Stability Vital Sign   . Unable to Pay for Housing in the Last Year: No   . Number of Times Moved in the Last Year: 0   . Homeless in the Last Year: No    Family History  Problem Relation Name Age of Onset  . ADD / ADHD Brother    . Anesthesia problems Neg Hx      Physical Exam: Vitals:   06/22/24 1326  BP: 101/64  Pulse: 99  Temp: 36.8 C (98.3 F)  TempSrc: Oral  Weight: 94.8 kg (209 lb)  Height: 160 cm (5' 3)  PainSc:   6  PainLoc: Leg   General: Well developed, well nourished, NAD HEENT: [place]/AT, No Masses, PEERL Skin: no lesions, masses, good turgor Musculoskeletal: strength 5/5, normal gait, good ROM Neuro: CN II-XII  intact Psych: A&O X3, Normal Affect  Pulses:intact Extremities:warm no masses or edema, no TTP or issues with leg raise of ambulation  Imaging: Stable SVT  Assessment: RLE venous malformation Scoliosis Legs paain SVT LLE SSV   Plan: F/U as scheduled, no further interventions required. Will see her in 6 months.    Attestation Statement:   I personally performed the service, non-incident to. (WP)   TORIBIO JULIANNA KERRY, PA  To the patients reading this note   Please be advised the primary purpose of this note is to keep track of your care and communicate with other members of your medical team. Standard sentence structure is not always used. Medical terminology and medical abbreviations may be used. Parts of this note may have been generated using voice dictation software. There may  be wrong-word or sound-alike substitution errors and typographical errors missed in proofreading.

## 2024-10-04 ENCOUNTER — Encounter: Admitting: Women's Health

## 2024-10-23 DIAGNOSIS — M792 Neuralgia and neuritis, unspecified: Secondary | ICD-10-CM | POA: Diagnosis not present

## 2024-10-23 DIAGNOSIS — R7989 Other specified abnormal findings of blood chemistry: Secondary | ICD-10-CM | POA: Diagnosis not present

## 2024-10-23 DIAGNOSIS — E559 Vitamin D deficiency, unspecified: Secondary | ICD-10-CM | POA: Diagnosis not present

## 2024-10-23 NOTE — Progress Notes (Signed)
 Neurology Consultation  [name] is a 23 y/o RH female with h/o chronic right sided low back pain previously seen by Dr. Jannett Higgins in 2021, congenital vascular malformation, superficial vein thrombosis, DIC, scoliosis thoracolumbar region referred to clinic by Joanna Kerry PA for left posterior leg numbness, cold sensations and pain after sclerotherapy. His note documents that her pulses are excellent.   History of Present Illness Joanna Higgins is a 23 year old female who presents with left leg numbness, cold sensation, and pain. She is accompanied by her brother. She was referred by another doctor for evaluation of left leg symptoms, suspected to be lymphedema.  She experiences numbness, a cold sensation, and stabbing pain in her left leg, primarily on the side of her thigh extending to the top of her foot. Symptoms began a month ago following an injection in the same area. The pain is intermittent and can be severe, reaching 10 out of 10, and sometimes impedes walking.  She has tried a knee brace and compression socks without sufficient relief. Icy Hot and an over-the-counter cream provide some relief, while Tylenol  is minimally effective. She has not used any patches for the pain.  No similar symptoms are present in her right leg or elsewhere. There is no back pain or weakness in the left leg. She does report spraining her left ankle multiple times in the past when asked about distal leg trauma.   Past Medical History:  Diagnosis Date  . Aftercare, orthodontic    top and bottom retainer  . Constipation   . GERD (gastroesophageal reflux disease)   . History of anesthesia reaction    Of note for anesthesia she does wake up a little more anxious upon last treatment she pulled her IV and required some anxiolytics.  Will plan on also sending the patient home with some Ativan or Valium with some pain medication following her next treatment.  . Scab    over area from 4/24  sclerotherapy  . Superficial vein thrombosis   . 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 SCLEROTHERAPY VEIN ABDOMEN Right 10/19/2012   Procedure: SCLEROTHERAPY VEIN ABDOMEN/ RIGHT FLANK VENOUS MALFORMATION ;  Surgeon: Montie Leeroy Shellia Curvin, MD;  Location: [facility] OR;  Service: General Surgery;  Laterality: Right;  . SCLEROTHERAPY VEIN LEG Right 04/19/2013   Procedure: RIGHT FLANK MALFORMATION SCLEROTHERAPY;  Surgeon: Montie Leeroy Shellia Curvin, MD;  Location: [facility] OR;  Service: Vascular Surgery;  Laterality: Right;  . SCLEROTHERAPY VEIN LEG Right 05/17/2013   Procedure: RIGHT FLANK MALFORMATION SCLEROTHERAPY;  Surgeon: Merilee Lemond Free, MD;  Location: [facility] OR;  Service: General Surgery;  Laterality: Right;  . SCLEROTHERAPY VEIN LEG Right 09/09/2016   Procedure: INJECTION OF SCLEROSING SOLUTION; MULTIPLE VEINS,  Flank;  Surgeon: Montie Leeroy Shellia Curvin, MD;  Location: [facility] OR;  Service: General Surgery;  Laterality: Right;  . CHOLECYSTECTOMY     2021 lap      Family History  Problem Relation Name Age of Onset  . Iron deficiency Mother    . Diabetes Brother 69   . ADD / ADHD Brother 80   . Iron deficiency Brother 25   . Anesthesia problems Neg Hx      Social History   Socioeconomic History  . Marital status: Single  . Number of children: 0  . Highest education level: High school graduate  Occupational History  . Occupation: 11TH grader  Tobacco Use  .  Smoking status: Every Day    Types: Cigarettes  . Smokeless tobacco: Never  . Tobacco comments:    1 cigarette per day  Vaping Use  . Vaping status: Some Days  . Substances: Nicotine, Flavoring  Substance and Sexual Activity  . Alcohol use: No  . Drug use: No  . Sexual activity: Defer  Social History Narrative   Single and lives with her boyfriend. HS degree. Does some [employer].   Social Drivers of Corporate Investment Banker  Strain: Low Risk  (05/16/2024)   Overall Financial Resource Strain (CARDIA)   . Difficulty of Paying Living Expenses: Not hard at all  Food Insecurity: No Food Insecurity (05/16/2024)   Hunger Vital Sign   . Worried About Programme Researcher, Broadcasting/film/video in the Last Year: Never true   . Ran Out of Food in the Last Year: Never true  Transportation Needs: No Transportation Needs (05/16/2024)   PRAPARE - Transportation   . Lack of Transportation (Medical): No   . Lack of Transportation (Non-Medical): No  Physical Activity: Inactive (04/10/2020)   Received from [facility]   Exercise Vital Sign   . On average, how many days per week do you engage in moderate to strenuous exercise (like a brisk walk)?: 0 days   . On average, how many minutes do you engage in exercise at this level?: 0 min  Stress: No Stress Concern Present (04/10/2020)   Received from [facility] - Occupational Stress Questionnaire   . Feeling of Stress : Not at all  Social Connections: Unknown (04/10/2020)   Received from [facility]   Social Connection and Isolation Panel   . In a typical week, how many times do you talk on the phone with family, friends, or neighbors?: Never   . How often do you get together with friends or relatives?: Never   . How often do you attend church or religious services?: Never   . Do you belong to any clubs or organizations such as church groups, unions, fraternal or athletic groups, or school groups?: No   . How often do you attend meetings of the clubs or organizations you belong to?: Never   . Are you married, widowed, divorced, separated, never married, or living with a partner?: Patient declined  Housing Stability: Low Risk  (05/16/2024)   Housing Stability Vital Sign   . Unable to Pay for Housing in the Last Year: No   . Number of Times Moved in the Last Year: 0   . Homeless in the Last Year: No    Allergies  Allergen Reactions  . Estrogens Other (See  Comments)    Specialist recommends avoidance of estrogen due to history of DIC  . Hydrocodone  Nausea And Vomiting    Medications ASA 81 mg daily Tylenol  1000 mg as needed daily    Physical Exam Vitals:   10/23/24 1123  BP: 110/78  Pulse: 99    MEASUREMENTS: BMI- 36.0. Gen: female presenting with her brother HEENT: Normocephalic, atraumatic. Sclera clear. Mucous membranes moist. Minimal oropharyngeal crowding, dentition fair repair. EXTREMITIES: No edema, legs and feet symmetric. Dorsalis pedis pulses intact bilaterally. Capillary refill normal. PSYCHIATRIC: Appears anxious. MENTAL STATUS: Alert and oriented x3. Language is fluent with no dysarthria. She is able to provide limited history. States that her Mom usually helps with her medical history. She was able to recall some details when asked as reviewed her chart. Attention fair, she was distracted  by her phone at times. Able to follow commands with additional coaching for some testing. CRANIAL NERVES: Pupils 2.5 mm, round, brisk bilaterally. Visual fields full. Full extraocular movements with cueing. Facial sensation intact, strength symmetric. Hearing intact to finger rub. Palate elevation normal, tongue midline. Shoulder shrug and SCM intact. MOTOR: Normal bulk, full strength in arms and legs. No drift, no involuntary movements. REFLEXES: Reflexes: biceps, triceps, brachioradialis 3+, patellar 2+, Achilles 2+, no clonus, mute plantars. SENSATION: Sensation, vibration, temperature, proprioception intact. Diminished sharp sensation left great toe and web space, intact elsewhere. COORDINATION: Coordination intact, finger taps, arm rolling, finger-nose, heel-shin. GAIT: Gait slightly antalgic, otherwise normal.   Results Labs: 09/2023: CMP normal, cbc notable for wbc 13.7, D-dimer elevated 609, fibrinogen normal 371, PT/PTT/INR normal  Xray entire spine 2018: Findings:  Thoracic dextroscoliosis of 26 degrees with lumbar  levoscoliosis of 19 degrees.  Impression:  S-shaped thoracolumbar scoliosis, as above.     Brain MRI 2003: Impression: 1. CSF collection in the right posterior fossa, contiguous with the fourth ventricle. 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 2. Sinus disease involving the bilateral maxillary sinuses, with complete opacification of the right maxillary sinus. 3. Normal lumbar spine. 4. 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   Lumbar MRI 2003: Lumbar spine: The lumbar spine demonstrates slight straightening which is likely positional. There is no listhesis. Marrow signal is normal throughout. The conus is at the L2 level. There is no abnormal signal within the visualized cord nor in the extradural space. No abnormal flow voids are seen in the extradural space. Within the subcutaneous tissues of the right lower back, there are plaque-like collections of abnormal signal and flow voids consistent with a vascular malformation; correlate with the dedicated MRI of the abdomen.   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 Her pain is in the lateral thigh and lateral distal leg primarily so suspect the numbness in the foot is unrelated. She does mention ankle trauma in the past so suspect the reduced sensation in the deep peroneal nerve distribution is chronic and unrelated to her left leg pain. Differential includes lumbosacral radiculopathy or widespread neuropathy.  - Order EMG nerve conduction test to evaluate for lumbosacral radiculopathy, peroneal neuropathy, or widespread neuropathy. - Check labs:  vitamin D, copper, folate, hemoglobin A1c, methylmalonic acid, thyroid profile, B1, B12. - Prescribe gabapentin 100 mg TID for pain management. - Provided suggestions for topical pain management options. - Schedule follow-up in 8 weeks post-EMG.      Attestation Statement:   I personally performed the service. (TP)  JODI J HAWES, MD   I spent a total of 60 minutes in both face-to-face and non-face-to-face activities, excluding procedures performed, for this visit on the date of this encounter.    Addendum: some labs have resulted and her B12 is low at 168, range 180-914. She was called and ordered both the monthly injection and po tabs. She is concerned about cost and this way can see which is affordable. My preference is the injection. Her folate was normal 8.4. Vitamin D low at 19 and ordered vitamin D high dose supplementation for 1 month. She is  aware. HA1C normal 5.6. TFT normal.

## 2024-10-23 NOTE — Progress Notes (Signed)
Patient arrived in clinic today for appointment with scheduled provider. Patient was called back to exam room for initial intake from nursing staff and identified via name and date of birth per protocol.     Nursing staff assessed height and weight and obtained accurate vital signs.      In active dialogue with patient reviewed chief complaint, completed a falls risk assessment for safety needs, reviewed allergies, did a complete medication review including updating the preferred pharmacy with the location and contact numbers.    The patient's pain was assessed with the corresponding pain scale noted in the pain flowsheet.  All questions and concerns addressed at this time regarding the patient's visit thus far.  Refill needs and personal or clinic paperwork was discussed.       Nursing staff provided the patient with their appropriate paperwork per providers request to complete for pending visit.

## 2024-10-24 ENCOUNTER — Emergency Department

## 2024-10-24 ENCOUNTER — Emergency Department
Admission: EM | Admit: 2024-10-24 | Discharge: 2024-10-24 | Disposition: A | Discharge status: Home / Self Care | Attending: Emergency Medicine | Admitting: Emergency Medicine

## 2024-10-24 ENCOUNTER — Other Ambulatory Visit: Payer: Self-pay

## 2024-10-24 DIAGNOSIS — M79605 Pain in left leg: Secondary | ICD-10-CM | POA: Diagnosis not present

## 2024-10-24 NOTE — ED Provider Notes (Signed)
 [facility] Provider Note    Event Date/Time   First MD Initiated Contact with Patient 10/24/24 1551     (approximate)   History   Leg Pain   HPI  Joanna Higgins is a 23 y.o. female with a history of vascular malformation, DVT in the past who presents with complaints of left leg cramping sensation and a feeling of coldness in her left foot.  She reports this is similar to when she had a DVT in the past which was treated with Lovenox  and she is no longer on blood thinners.  She also is taking gabapentin started yesterday for sciatica     Physical Exam   Triage Vital Signs: ED Triage Vitals  Encounter Vitals Group     BP 10/24/24 1543 (!) 129/92     Girls Systolic BP Percentile --      Girls Diastolic BP Percentile --      Boys Systolic BP Percentile --      Boys Diastolic BP Percentile --      Pulse Rate 10/24/24 1543 (!) 101     Resp 10/24/24 1543 17     Temp 10/24/24 1543 98.2 F (36.8 C)     Temp Source 10/24/24 1543 Oral     SpO2 10/24/24 1543 98 %     Weight 10/24/24 1541 91.2 kg (201 lb)     Height 10/24/24 1541 1.6 m (5' 3)     Head Circumference --      Peak Flow --      Pain Score 10/24/24 1541 8     Pain Loc --      Pain Education --      Exclude from Growth Chart --     Most recent vital signs: Vitals:   10/24/24 1543 10/24/24 1726  BP: (!) 129/92 103/70  Pulse: (!) 101 93  Resp: 17 16  Temp: 98.2 F (36.8 C)   SpO2: 98% 98%     General: Awake, no distress.  CV:  Good peripheral perfusion.  Resp:  Normal effort.  Abd:  No distention.  Other:  Palpable pulses in the left DP, no significant swelling or redness of the calf   ED Results / Procedures / Treatments   Labs (all labs ordered are listed, but only abnormal results are displayed) Labs Reviewed - No data to display   EKG     RADIOLOGY Ultrasound negative for DVT    PROCEDURES:  Critical Care performed:   Procedures   MEDICATIONS ORDERED  IN ED: Medications - No data to display   IMPRESSION / MDM / ASSESSMENT AND PLAN / ED COURSE  I reviewed the triage vital signs and the nursing notes. Patient's presentation is most consistent with acute complicated illness / injury requiring diagnostic workup.  Patient presents with symptoms as above, differential includes sciatica, DVT, less likely arterial issue  Good pulses on exam.  Ultrasound negative for DVT, she and her family are reassured by this, and notes her symptoms have resolved, appropriate for discharge at this time with outpatient follow-up        FINAL CLINICAL IMPRESSION(S) / ED DIAGNOSES   Final diagnoses:  Left leg pain     Rx / DC Orders   ED Discharge Orders     None        Note:  This document was prepared using Dragon voice recognition software and may include unintentional dictation errors.   Arlander Charleston, MD 10/24/24 1800

## 2024-10-24 NOTE — ED Triage Notes (Signed)
 Pt arrived via EMS from home due to a varicose vein procedure. Pt sts that she has had increased pain in the left leg since surgery. Per EMS sts that pt told them that she has lossed sensation in the lower left ext. Pt was prescribed gabapentin and started it yesterday.

## 2024-11-08 ENCOUNTER — Other Ambulatory Visit: Payer: Self-pay

## 2024-11-08 ENCOUNTER — Emergency Department

## 2024-11-08 ENCOUNTER — Emergency Department
Admission: EM | Admit: 2024-11-08 | Discharge: 2024-11-08 | Disposition: A | Discharge status: Home / Self Care | Attending: Emergency Medicine | Admitting: Emergency Medicine

## 2024-11-08 DIAGNOSIS — D72829 Elevated white blood cell count, unspecified: Secondary | ICD-10-CM | POA: Diagnosis not present

## 2024-11-08 DIAGNOSIS — R1031 Right lower quadrant pain: Secondary | ICD-10-CM | POA: Diagnosis not present

## 2024-11-08 DIAGNOSIS — R109 Unspecified abdominal pain: Secondary | ICD-10-CM

## 2024-11-08 DIAGNOSIS — N200 Calculus of kidney: Secondary | ICD-10-CM | POA: Diagnosis not present

## 2024-11-08 DIAGNOSIS — I10 Essential (primary) hypertension: Secondary | ICD-10-CM | POA: Diagnosis not present

## 2024-11-08 DIAGNOSIS — R1011 Right upper quadrant pain: Secondary | ICD-10-CM | POA: Diagnosis not present

## 2024-11-08 LAB — URINALYSIS, ROUTINE W REFLEX MICROSCOPIC
Bilirubin Urine: NEGATIVE
Glucose, UA: NEGATIVE mg/dL
Hgb urine dipstick: NEGATIVE
Ketones, ur: NEGATIVE mg/dL
Leukocytes,Ua: NEGATIVE
Nitrite: NEGATIVE
Protein, ur: NEGATIVE mg/dL
Specific Gravity, Urine: >1.046 — ABNORMAL HIGH (ref 1.005–1.030)
pH: 7 (ref 5.0–8.0)

## 2024-11-08 LAB — LIPASE, BLOOD: Lipase: 19 U/L (ref 11–51)

## 2024-11-08 LAB — COMPREHENSIVE METABOLIC PANEL WITH GFR
ALT: 16 U/L (ref 0–44)
AST: 28 U/L (ref 15–41)
Albumin: 4.4 g/dL (ref 3.5–5.0)
Alkaline Phosphatase: 73 U/L (ref 38–126)
Anion gap: 9 (ref 5–15)
BUN: 17 mg/dL (ref 6–20)
CO2: 26 mmol/L (ref 22–32)
Calcium: 9.3 mg/dL (ref 8.9–10.3)
Chloride: 104 mmol/L (ref 98–111)
Creatinine, Ser: 0.67 mg/dL (ref 0.44–1.00)
GFR, Estimated: >60 mL/min (ref >60)
Glucose, Bld: 99 mg/dL (ref 70–99)
Potassium: 4.6 mmol/L (ref 3.5–5.1)
Sodium: 139 mmol/L (ref 135–145)
Total Bilirubin: 0.3 mg/dL (ref 0.0–1.2)
Total Protein: 7.2 g/dL (ref 6.5–8.1)

## 2024-11-08 LAB — CBC
HCT: 42.9 % (ref 36.0–46.0)
Hemoglobin: 13.6 g/dL (ref 12.0–15.0)
MCH: 29.1 pg (ref 26.0–34.0)
MCHC: 31.7 g/dL (ref 30.0–36.0)
MCV: 91.7 fL (ref 80.0–100.0)
Platelets: 318 K/uL (ref 150–400)
RBC: 4.68 MIL/uL (ref 3.87–5.11)
RDW: 14.1 % (ref 11.5–15.5)
WBC: 16.6 K/uL — ABNORMAL HIGH (ref 4.0–10.5)
nRBC: 0 % (ref 0.0–0.2)

## 2024-11-08 LAB — POC URINE PREG, ED: Preg Test, Ur: NEGATIVE

## 2024-11-08 MED ORDER — KETOROLAC TROMETHAMINE 30 MG/ML IJ SOLN
30.0000 mg | Freq: Once | INTRAMUSCULAR | Status: AC
  Administered 2024-11-08: 30 mg via INTRAVENOUS
  Filled 2024-11-08: qty 1

## 2024-11-08 MED ORDER — DICYCLOMINE HCL 10 MG PO CAPS: 10.0000 mg | ORAL_CAPSULE | Freq: Three times a day (TID) | ORAL | 0 refills | Status: AC

## 2024-11-08 MED ORDER — ONDANSETRON HCL 4 MG/2ML IJ SOLN
4.0000 mg | Freq: Once | INTRAMUSCULAR | Status: AC
  Administered 2024-11-08: 4 mg via INTRAVENOUS
  Filled 2024-11-08: qty 2

## 2024-11-08 MED ORDER — IOHEXOL 300 MG/ML  SOLN
100.0000 mL | Freq: Once | INTRAMUSCULAR | Status: AC | PRN
  Administered 2024-11-08: 100 mL via INTRAVENOUS

## 2024-11-08 NOTE — ED Triage Notes (Signed)
 Patient states RUQ pain since last night; denies N/V/D.

## 2024-11-08 NOTE — ED Provider Notes (Signed)
 [facility] Provider Note    Event Date/Time   First MD Initiated Contact with Patient 11/08/24 [phone]     (approximate)   History   Abdominal Pain   HPI  Joanna Higgins is a 23 y.o. female with a history of cholecystectomy who presents with complaints of with right sided flank/abdominal pain onset yesterday evening.  She reports the pain has been constant.  She denies chest pain.  No fevers chills or cough.     Physical Exam   Triage Vital Signs: ED Triage Vitals  Encounter Vitals Group     BP 11/08/24 0936 114/70     Girls Systolic BP Percentile --      Girls Diastolic BP Percentile --      Boys Systolic BP Percentile --      Boys Diastolic BP Percentile --      Pulse Rate 11/08/24 0935 90     Resp 11/08/24 0935 18     Temp 11/08/24 0935 98.6 F (37 C)     Temp Source 11/08/24 0935 Oral     SpO2 11/08/24 0935 96 %     Weight --      Height --      Head Circumference --      Peak Flow --      Pain Score 11/08/24 0934 10     Pain Loc --      Pain Education --      Exclude from Growth Chart --     Most recent vital signs: Vitals:   11/08/24 0935 11/08/24 0936  BP:  114/70  Pulse: 90   Resp: 18   Temp: 98.6 F (37 C)   SpO2: 96%      General: Awake, no distress.  CV:  Good peripheral perfusion.  Resp:  Normal effort.  Abd:  No distention.  Mild tenderness right flank/right lower quadrant Other:  No calf pain or swelling   ED Results / Procedures / Treatments   Labs (all labs ordered are listed, but only abnormal results are displayed) Labs Reviewed  CBC - Abnormal; Notable for the following components:      Result Value   WBC 16.6 (*)    All other components within normal limits  URINALYSIS, ROUTINE W REFLEX MICROSCOPIC - Abnormal; Notable for the following components:   Color, Urine YELLOW (*)    APPearance CLEAR (*)    Specific Gravity, Urine >1.046 (*)    All other components within normal limits  LIPASE, BLOOD   COMPREHENSIVE METABOLIC PANEL WITH GFR  POC URINE PREG, ED     EKG     RADIOLOGY CT abdomen pelvis viewed interpret by me, no acute abnormality, confirmed by radiology    PROCEDURES:  Critical Care performed:   Procedures   MEDICATIONS ORDERED IN ED: Medications  ketorolac  (TORADOL ) 30 MG/ML injection 30 mg (30 mg Intravenous Given 11/08/24 1058)  ondansetron  (ZOFRAN ) injection 4 mg (4 mg Intravenous Given 11/08/24 1059)  iohexol  (OMNIPAQUE ) 300 MG/ML solution 100 mL (100 mLs Intravenous Contrast Given 11/08/24 1121)     IMPRESSION / MDM / ASSESSMENT AND PLAN / ED COURSE  I reviewed the triage vital signs and the nursing notes. Patient's presentation is most consistent with acute presentation with potential threat to life or bodily function.  Patient presents with abdominal pain as detailed above, primarily in the right middle to right lower quadrant/flank area.  Differential includes ureterolithiasis, appendicitis.  She has a history of cholecystectomy.  She denies dysuria  Will treat with IV Toradol , IV Zofran , lab work notable for elevated white blood cell count but urine is pending.  Will obtain CT abdomen pelvis to evaluate for appendicitis ureterolithiasis, possibly pyelonephritis  CT scan is reassuring, urinalysis is unremarkable, patient feeling better after treatment, no indication for admission at this time, appropriate for discharge with outpatient follow-up, Rx for Bentyl , return precautions discussed, she agrees with plan      FINAL CLINICAL IMPRESSION(S) / ED DIAGNOSES   Final diagnoses:  Abdominal pain, unspecified abdominal location     Rx / DC Orders   ED Discharge Orders          Ordered    dicyclomine  (BENTYL ) 10 MG capsule  3 times daily before meals & bedtime        11/08/24 1236             Note:  This document was prepared using Dragon voice recognition software and may include unintentional dictation errors.   Arlander Charleston, MD 11/08/24 1430

## 2025-01-06 ENCOUNTER — Encounter: Payer: Self-pay | Admitting: Emergency Medicine

## 2025-01-06 ENCOUNTER — Emergency Department
Admission: EM | Admit: 2025-01-06 | Discharge: 2025-01-07 | Disposition: A | Attending: Emergency Medicine | Admitting: Emergency Medicine

## 2025-01-06 DIAGNOSIS — S81811A Laceration without foreign body, right lower leg, initial encounter: Secondary | ICD-10-CM | POA: Insufficient documentation

## 2025-01-06 DIAGNOSIS — W25XXXA Contact with sharp glass, initial encounter: Secondary | ICD-10-CM | POA: Diagnosis not present

## 2025-01-06 NOTE — ED Triage Notes (Signed)
 Pt arrives POV ambulatory to triage, gait steady, no acute distress noted c/o cutting right leg w/ broken glass. Wound dressed w/ Band-Aid, bleeding controlled.

## 2025-01-06 NOTE — ED Provider Notes (Signed)
 "  [facility] Provider Note    Event Date/Time   First MD Initiated Contact with Patient 01/06/25 2344     (approximate)   History   Laceration   HPI  Joanna Higgins is a 24 y.o. female who presents to the ED for evaluation of Laceration   Patient presents to the ED after accidental laceration to the anterior right lower leg.  Was walking carrying a glass container, fell, glass shattered causing laceration   Physical Exam   Triage Vital Signs: ED Triage Vitals [01/06/25 2149]  Encounter Vitals Group     BP 137/89     Girls Systolic BP Percentile      Girls Diastolic BP Percentile      Boys Systolic BP Percentile      Boys Diastolic BP Percentile      Pulse Rate (!) 106     Resp 18     Temp 98.3 F (36.8 C)     Temp Source Oral     SpO2 97 %     Weight      Height      Head Circumference      Peak Flow      Pain Score      Pain Loc      Pain Education      Exclude from Growth Chart     Most recent vital signs: Vitals:   01/06/25 2149  BP: 137/89  Pulse: (!) 106  Resp: 18  Temp: 98.3 F (36.8 C)  SpO2: 97%    General: Awake, no distress.  CV:  Good peripheral perfusion.  Resp:  Normal effort.  Abd:  No distention.  MSK:  No deformity noted.  Neuro:  No focal deficits appreciated. Other:  Isolated injury, laceration to the subcutaneous tissue of the anterior right shin.  Full active range of motion without signs of deep laceration to cause tendon disruption.  Soft compartments, distally neurovascularly intact   ED Results / Procedures / Treatments   Labs (all labs ordered are listed, but only abnormal results are displayed) Labs Reviewed - No data to display  EKG   RADIOLOGY   Official radiology report(s): No results found.  PROCEDURES and INTERVENTIONS:  .Laceration Repair  Date/Time: 01/07/2025 1:34 AM  Performed by: Claudene Rover, MD Authorized by: Claudene Rover, MD   Consent:    Consent obtained:   Verbal   Consent given by:  Patient   Risks, benefits, and alternatives were discussed: yes   Laceration details:    Location:  Leg   Leg location:  R lower leg   Length (cm):  5 Exploration:    Hemostasis achieved with:  Direct pressure   Contaminated: no   Skin repair:    Repair method:  Sutures   Suture size:  4-0   Wound skin closure material used: Monocryl.   Suture technique:  Running locked   Number of sutures:  8 Approximation:    Approximation:  Close Repair type:    Repair type:  Simple Post-procedure details:    Dressing:  Open (no dressing)   Procedure completion:  Tolerated well, no immediate complications   Medications  lidocaine  (PF) (XYLOCAINE ) 1 % injection 10 mL (10 mLs Infiltration Given 01/07/25 0054)     IMPRESSION / MDM / ASSESSMENT AND PLAN / ED COURSE  I reviewed the triage vital signs and the nursing notes.  Differential diagnosis includes, but is not limited to, accidental laceration, self-inflicted intentional laceration,  tendon disruption  {Patient presents with symptoms of an acute illness or injury that is potentially life-threatening.  Patient presents with isolated superficial laceration of right lower leg.  Into the subcutaneous tissue but nothing deeper.  Cleaned, repaired and discussed outpatient management.  Clinical Course as of 01/07/25 0135  Mon Jan 07, 2025  0130 X8 running locked 4-0 monocryl  [DS]    Clinical Course User Index [DS] Claudene Rover, MD     FINAL CLINICAL IMPRESSION(S) / ED DIAGNOSES   Final diagnoses:  Leg laceration, right, initial encounter     Rx / DC Orders   ED Discharge Orders     None        Note:  This document was prepared using Dragon voice recognition software and may include unintentional dictation errors.   Claudene Rover, MD 01/07/25 0136  "

## 2025-01-07 MED ORDER — LIDOCAINE HCL (PF) 1 % IJ SOLN
10.0000 mL | Freq: Once | INTRAMUSCULAR | Status: AC
  Administered 2025-01-07: 10 mL
  Filled 2025-01-07: qty 10

## 2025-01-07 NOTE — Discharge Instructions (Addendum)
--  8 stitches will absorb on their own  --Gently wash the wound with soap and water.  It is okay to shower, but do not submerge in a bath or go swimming as it is healing.  Do not vigorously scrub.   Gently pat dry.   Once dry, then apply Neosporin or bacitracin or even Vaseline ointment to the area to act as a barrier to help prevent infection.  --Please take Tylenol  and ibuprofen /Advil  for your pain.  It is safe to take them together, or to alternate them every few hours.  Take up to 1000mg  of Tylenol  at a time, up to 4 times per day.  Do not take more than 4000 mg of Tylenol  in 24 hours.  For ibuprofen , take 400-600 mg, 3 - 4 times per day.
